# Patient Record
Sex: Male | Born: 1990 | Race: Black or African American | Hispanic: No | Marital: Single | Smoking: Current every day smoker
Health system: Southern US, Community
[De-identification: ages and names within clinical notes are randomized; demographics above are authoritative.]

## PROBLEM LIST (undated history)

## (undated) DIAGNOSIS — W3400XA Accidental discharge from unspecified firearms or gun, initial encounter: Secondary | ICD-10-CM

---
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
## 2010-09-23 LAB — URINALYSIS, ROUTINE W REFLEX MICROSCOPIC
Bilirubin Urine: NEGATIVE
Ketones, ur: NEGATIVE mg/dL
Nitrite: NEGATIVE
Protein, ur: NEGATIVE mg/dL
Specific Gravity, Urine: 1.025 (ref 1.005–1.030)
Urobilinogen, UA: 0.2 mg/dL (ref 0.0–1.0)

## 2013-11-12 ENCOUNTER — Inpatient Hospital Stay (HOSPITAL_COMMUNITY)
Admission: EM | Admit: 2013-11-12 | Discharge: 2013-11-14 | DRG: 563 | Disposition: A | Discharge status: Home Health | Payer: Managed Care, Other (non HMO) | Attending: Orthopaedic Surgery | Admitting: Orthopaedic Surgery

## 2013-11-12 ENCOUNTER — Emergency Department (HOSPITAL_COMMUNITY): Payer: Managed Care, Other (non HMO)

## 2013-11-12 ENCOUNTER — Encounter (HOSPITAL_COMMUNITY): Payer: Self-pay | Admitting: Emergency Medicine

## 2013-11-12 DIAGNOSIS — W3400XA Accidental discharge from unspecified firearms or gun, initial encounter: Secondary | ICD-10-CM

## 2013-11-12 DIAGNOSIS — S81831A Puncture wound without foreign body, right lower leg, initial encounter: Secondary | ICD-10-CM

## 2013-11-12 DIAGNOSIS — F17200 Nicotine dependence, unspecified, uncomplicated: Secondary | ICD-10-CM | POA: Diagnosis present

## 2013-11-12 DIAGNOSIS — S82109B Unspecified fracture of upper end of unspecified tibia, initial encounter for open fracture type I or II: Principal | ICD-10-CM | POA: Diagnosis present

## 2013-11-12 DIAGNOSIS — S82209B Unspecified fracture of shaft of unspecified tibia, initial encounter for open fracture type I or II: Secondary | ICD-10-CM

## 2013-11-12 DIAGNOSIS — S71139A Puncture wound without foreign body, unspecified thigh, initial encounter: Secondary | ICD-10-CM

## 2013-11-12 LAB — CBC WITH DIFFERENTIAL/PLATELET
BASOS ABS: 0.1 10*3/uL (ref 0.0–0.1)
Basophils Relative: 1 % (ref 0–1)
EOS ABS: 0.2 10*3/uL (ref 0.0–0.7)
Eosinophils Relative: 3 % (ref 0–5)
HCT: 41.1 % (ref 39.0–52.0)
Hemoglobin: 13.8 g/dL (ref 13.0–17.0)
LYMPHS PCT: 38 % (ref 12–46)
Lymphs Abs: 2.5 10*3/uL (ref 0.7–4.0)
MCH: 31.6 pg (ref 26.0–34.0)
MCHC: 33.6 g/dL (ref 30.0–36.0)
MCV: 94.1 fL (ref 78.0–100.0)
Monocytes Absolute: 0.8 10*3/uL (ref 0.1–1.0)
Monocytes Relative: 12 % (ref 3–12)
NEUTROS ABS: 3.1 10*3/uL (ref 1.7–7.7)
NEUTROS PCT: 46 % (ref 43–77)
PLATELETS: 296 10*3/uL (ref 150–400)
RBC: 4.37 MIL/uL (ref 4.22–5.81)
RDW: 13.8 % (ref 11.5–15.5)
SMEAR REVIEW: ADEQUATE
WBC: 6.7 10*3/uL (ref 4.0–10.5)

## 2013-11-12 LAB — BASIC METABOLIC PANEL
BUN: 9 mg/dL (ref 6–23)
CO2: 21 mEq/L (ref 19–32)
Calcium: 9.2 mg/dL (ref 8.4–10.5)
Chloride: 98 mEq/L (ref 96–112)
Creatinine, Ser: 1.12 mg/dL (ref 0.50–1.35)
Glucose, Bld: 118 mg/dL — ABNORMAL HIGH (ref 70–99)
POTASSIUM: 3.1 meq/L — AB (ref 3.7–5.3)
SODIUM: 140 meq/L (ref 137–147)

## 2013-11-12 MED ORDER — TETANUS-DIPHTH-ACELL PERTUSSIS 5-2.5-18.5 LF-MCG/0.5 IM SUSP
0.5000 mL | Freq: Once | INTRAMUSCULAR | Status: AC
  Administered 2013-11-12: 0.5 mL via INTRAMUSCULAR
  Filled 2013-11-12: qty 0.5

## 2013-11-12 MED ORDER — CEFAZOLIN SODIUM 1-5 GM-% IV SOLN
1.0000 g | Freq: Once | INTRAVENOUS | Status: AC
  Administered 2013-11-12: 1 g via INTRAVENOUS
  Filled 2013-11-12: qty 50

## 2013-11-12 MED ORDER — VANCOMYCIN HCL IN DEXTROSE 1-5 GM/200ML-% IV SOLN
1000.0000 mg | Freq: Once | INTRAVENOUS | Status: AC
  Administered 2013-11-12: 1000 mg via INTRAVENOUS
  Filled 2013-11-12: qty 200

## 2013-11-12 MED ORDER — HYDROMORPHONE HCL PF 1 MG/ML IJ SOLN
1.0000 mg | INTRAMUSCULAR | Status: DC | PRN
  Administered 2013-11-12: 1 mg via INTRAVENOUS
  Filled 2013-11-12: qty 1

## 2013-11-12 NOTE — ED Notes (Signed)
Surgical team called into department for patient.
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
## 2013-11-13 ENCOUNTER — Encounter (HOSPITAL_COMMUNITY): Payer: Managed Care, Other (non HMO) | Admitting: Anesthesiology

## 2013-11-13 ENCOUNTER — Inpatient Hospital Stay (HOSPITAL_COMMUNITY): Payer: Managed Care, Other (non HMO) | Admitting: Anesthesiology

## 2013-11-13 ENCOUNTER — Encounter (HOSPITAL_COMMUNITY): Payer: Self-pay | Admitting: *Deleted

## 2013-11-13 ENCOUNTER — Inpatient Hospital Stay (HOSPITAL_COMMUNITY): Payer: Managed Care, Other (non HMO)

## 2013-11-13 ENCOUNTER — Encounter (HOSPITAL_COMMUNITY)
Admission: EM | Disposition: A | Discharge status: Home Health | Payer: Self-pay | Source: Home / Self Care | Attending: Orthopaedic Surgery

## 2013-11-13 DIAGNOSIS — S71139A Puncture wound without foreign body, unspecified thigh, initial encounter: Secondary | ICD-10-CM

## 2013-11-13 DIAGNOSIS — W3400XA Accidental discharge from unspecified firearms or gun, initial encounter: Secondary | ICD-10-CM

## 2013-11-13 HISTORY — PX: FOREIGN BODY REMOVAL: SHX962

## 2013-11-13 HISTORY — PX: CAST APPLICATION: SHX380

## 2013-11-13 LAB — BASIC METABOLIC PANEL
BUN: 6 mg/dL (ref 6–23)
CHLORIDE: 102 meq/L (ref 96–112)
CO2: 26 mEq/L (ref 19–32)
CREATININE: 0.9 mg/dL (ref 0.50–1.35)
Calcium: 8.5 mg/dL (ref 8.4–10.5)
GFR calc non Af Amer: >90 mL/min (ref >90)
GLUCOSE: 124 mg/dL — AB (ref 70–99)
POTASSIUM: 3.6 meq/L — AB (ref 3.7–5.3)
Sodium: 139 mEq/L (ref 137–147)

## 2013-11-13 LAB — CBC WITH DIFFERENTIAL/PLATELET
Basophils Absolute: 0 10*3/uL (ref 0.0–0.1)
Basophils Relative: 0 % (ref 0–1)
EOS PCT: 1 % (ref 0–5)
Eosinophils Absolute: 0.1 10*3/uL (ref 0.0–0.7)
HCT: 36.1 % — ABNORMAL LOW (ref 39.0–52.0)
HEMOGLOBIN: 12.2 g/dL — AB (ref 13.0–17.0)
Lymphocytes Relative: 24 % (ref 12–46)
Lymphs Abs: 2.1 10*3/uL (ref 0.7–4.0)
MCH: 31.5 pg (ref 26.0–34.0)
MCHC: 33.8 g/dL (ref 30.0–36.0)
MCV: 93.3 fL (ref 78.0–100.0)
MONOS PCT: 12 % (ref 3–12)
Monocytes Absolute: 1.1 10*3/uL — ABNORMAL HIGH (ref 0.1–1.0)
NEUTROS ABS: 5.5 10*3/uL (ref 1.7–7.7)
Neutrophils Relative %: 63 % (ref 43–77)
Platelets: 288 10*3/uL (ref 150–400)
RBC: 3.87 MIL/uL — ABNORMAL LOW (ref 4.22–5.81)
RDW: 14 % (ref 11.5–15.5)
WBC: 8.7 10*3/uL (ref 4.0–10.5)

## 2013-11-13 SURGERY — REMOVAL FOREIGN BODY EXTREMITY
Anesthesia: General | Site: Leg Lower | Laterality: Right

## 2013-11-13 MED ORDER — DIPHENHYDRAMINE HCL 12.5 MG/5ML PO ELIX
12.5000 mg | ORAL_SOLUTION | Freq: Four times a day (QID) | ORAL | Status: DC | PRN
  Filled 2013-11-13: qty 5

## 2013-11-13 MED ORDER — MAGNESIUM HYDROXIDE 400 MG/5ML PO SUSP
30.0000 mL | Freq: Every day | ORAL | Status: DC | PRN
  Filled 2013-11-13: qty 30

## 2013-11-13 MED ORDER — ARTIFICIAL TEARS OP OINT
TOPICAL_OINTMENT | OPHTHALMIC | Status: DC | PRN
  Administered 2013-11-13: 1 via OPHTHALMIC

## 2013-11-13 MED ORDER — ALBUTEROL SULFATE (2.5 MG/3ML) 0.083% IN NEBU: 2.5000 mg | INHALATION_SOLUTION | Freq: Four times a day (QID) | RESPIRATORY_TRACT | Status: DC | PRN

## 2013-11-13 MED ORDER — ALBUTEROL SULFATE (2.5 MG/3ML) 0.083% IN NEBU: 2.5000 mg | INHALATION_SOLUTION | Freq: Four times a day (QID) | RESPIRATORY_TRACT | Status: DC

## 2013-11-13 MED ORDER — ONDANSETRON HCL 4 MG/2ML IJ SOLN: 4.0000 mg | Freq: Four times a day (QID) | INTRAMUSCULAR | Status: DC | PRN

## 2013-11-13 MED ORDER — DEXTROSE-NACL 5-0.45 % IV SOLN
INTRAVENOUS | Status: DC
  Administered 2013-11-13 (×2): via INTRAVENOUS

## 2013-11-13 MED ORDER — SUCCINYLCHOLINE CHLORIDE 20 MG/ML IJ SOLN
INTRAMUSCULAR | Status: DC | PRN
  Administered 2013-11-13: 140 mg via INTRAVENOUS

## 2013-11-13 MED ORDER — MIDAZOLAM HCL 5 MG/5ML IJ SOLN
INTRAMUSCULAR | Status: DC | PRN
  Administered 2013-11-13: 2 mg via INTRAVENOUS

## 2013-11-13 MED ORDER — LIDOCAINE HCL (CARDIAC) 20 MG/ML IV SOLN
INTRAVENOUS | Status: DC | PRN
Start: 2013-11-13 — End: 2013-11-13
  Administered 2013-11-13: 50 mg via INTRAVENOUS

## 2013-11-13 MED ORDER — NALOXONE HCL 0.4 MG/ML IJ SOLN: 0.4000 mg | INTRAMUSCULAR | Status: DC | PRN

## 2013-11-13 MED ORDER — SODIUM CHLORIDE 0.9 % IV SOLN
INTRAVENOUS | Status: DC | PRN
  Administered 2013-11-13: via INTRAVENOUS

## 2013-11-13 MED ORDER — LACTATED RINGERS IV SOLN
INTRAVENOUS | Status: DC | PRN
  Administered 2013-11-13: 01:00:00 via INTRAVENOUS

## 2013-11-13 MED ORDER — HYDROMORPHONE 0.3 MG/ML IV SOLN
INTRAVENOUS | Status: DC
  Administered 2013-11-13 (×2): 0.9 mg via INTRAVENOUS
  Administered 2013-11-13: 1.8 mg via INTRAVENOUS
  Administered 2013-11-13 – 2013-11-14 (×2): via INTRAVENOUS
  Filled 2013-11-13 (×2): qty 25

## 2013-11-13 MED ORDER — PROMETHAZINE HCL 25 MG/ML IJ SOLN: 12.5000 mg | INTRAMUSCULAR | Status: DC | PRN

## 2013-11-13 MED ORDER — ONDANSETRON HCL 4 MG/2ML IJ SOLN
INTRAMUSCULAR | Status: DC | PRN
  Administered 2013-11-13: 4 mg via INTRAVENOUS

## 2013-11-13 MED ORDER — ENOXAPARIN SODIUM 40 MG/0.4ML ~~LOC~~ SOLN
40.0000 mg | SUBCUTANEOUS | Status: DC
  Administered 2013-11-14: 40 mg via SUBCUTANEOUS
  Filled 2013-11-13: qty 0.4

## 2013-11-13 MED ORDER — PROPOFOL 10 MG/ML IV BOLUS
INTRAVENOUS | Status: DC | PRN
  Administered 2013-11-13: 180 mg via INTRAVENOUS

## 2013-11-13 MED ORDER — SODIUM CHLORIDE 0.9 % IR SOLN
Status: DC | PRN
  Administered 2013-11-13: 1000 mL
  Administered 2013-11-13: 3000 mL

## 2013-11-13 MED ORDER — FENTANYL CITRATE 0.05 MG/ML IJ SOLN
INTRAMUSCULAR | Status: DC | PRN
  Administered 2013-11-13 (×3): 50 ug via INTRAVENOUS
  Administered 2013-11-13: 100 ug via INTRAVENOUS

## 2013-11-13 MED ORDER — SODIUM CHLORIDE 0.9 % IJ SOLN: 9.0000 mL | INTRAMUSCULAR | Status: DC | PRN

## 2013-11-13 MED ORDER — DIPHENHYDRAMINE HCL 50 MG/ML IJ SOLN: 12.5000 mg | Freq: Four times a day (QID) | INTRAMUSCULAR | Status: DC | PRN

## 2013-11-13 MED ORDER — GLYCOPYRROLATE 0.2 MG/ML IJ SOLN
INTRAMUSCULAR | Status: DC | PRN
  Administered 2013-11-13: 0.4 mg via INTRAVENOUS

## 2013-11-13 MED ORDER — VANCOMYCIN HCL IN DEXTROSE 1-5 GM/200ML-% IV SOLN
1000.0000 mg | Freq: Two times a day (BID) | INTRAVENOUS | Status: DC
  Administered 2013-11-13 – 2013-11-14 (×3): 1000 mg via INTRAVENOUS
  Filled 2013-11-13 (×3): qty 200

## 2013-11-13 SURGICAL SUPPLY — 53 items
BAG HAMPER (MISCELLANEOUS) ×3 IMPLANT
BANDAGE ELASTIC 4 VELCRO NS (GAUZE/BANDAGES/DRESSINGS) ×1 IMPLANT
BANDAGE ELASTIC 6 VELCRO NS (GAUZE/BANDAGES/DRESSINGS) ×1 IMPLANT
BANDAGE ESMARK 4X12 BL STRL LF (DISPOSABLE) IMPLANT
BNDG CMPR 12X4 ELC STRL LF (DISPOSABLE)
BNDG ESMARK 4X12 BLUE STRL LF (DISPOSABLE)
BNDG PLASTER X FAST 4X5 WHT LF (CAST SUPPLIES) ×4 IMPLANT
BNDG PLASTER X FAST 6X5 WHT LF (CAST SUPPLIES) ×12 IMPLANT
BNDG PLSTR 5X4 XFST ST WHT LF (CAST SUPPLIES) ×2
BNDG PLSTR 5X6 XFST ST WHT LF (CAST SUPPLIES) ×6
CLOTH BEACON ORANGE TIMEOUT ST (SAFETY) ×3 IMPLANT
COVER LIGHT HANDLE STERIS (MISCELLANEOUS) ×6 IMPLANT
CUFF TOURNIQUET SINGLE 18IN (TOURNIQUET CUFF) IMPLANT
CUFF TOURNIQUET SINGLE 24IN (TOURNIQUET CUFF) IMPLANT
CUFF TOURNIQUET SINGLE 34IN LL (TOURNIQUET CUFF) ×4 IMPLANT
DURAPREP 26ML APPLICATOR (WOUND CARE) ×1 IMPLANT
GAUZE SPONGE 4X4 12PLY STRL (GAUZE/BANDAGES/DRESSINGS) ×3 IMPLANT
GAUZE XEROFORM 5X9 LF (GAUZE/BANDAGES/DRESSINGS) ×3 IMPLANT
GLOVE BIO SURGEON STRL SZ8 (GLOVE) ×3 IMPLANT
GLOVE BIO SURGEON STRL SZ8.5 (GLOVE) ×3 IMPLANT
GLOVE BIOGEL PI IND STRL 7.0 (GLOVE) IMPLANT
GLOVE BIOGEL PI IND STRL 7.5 (GLOVE) IMPLANT
GLOVE BIOGEL PI INDICATOR 7.0 (GLOVE) ×2
GLOVE BIOGEL PI INDICATOR 7.5 (GLOVE) ×2
GLOVE ECLIPSE 7.0 STRL STRAW (GLOVE) ×2 IMPLANT
GOWN STRL REUS W/TWL LRG LVL3 (GOWN DISPOSABLE) ×6 IMPLANT
GOWN STRL REUS W/TWL XL LVL3 (GOWN DISPOSABLE) ×3 IMPLANT
HANDPIECE INTERPULSE COAX TIP (DISPOSABLE) ×3
IV NS IRRIG 3000ML ARTHROMATIC (IV SOLUTION) ×2 IMPLANT
KIT ROOM TURNOVER APOR (KITS) ×3 IMPLANT
MANIFOLD NEPTUNE II (INSTRUMENTS) ×3 IMPLANT
NDL HYPO 21X1.5 SAFETY (NEEDLE) IMPLANT
NDL HYPO 25X1 1.5 SAFETY (NEEDLE) IMPLANT
NDL SAFETY ECLIPSE 18X1.5 (NEEDLE) IMPLANT
NEEDLE HYPO 18GX1.5 SHARP (NEEDLE)
NEEDLE HYPO 21X1.5 SAFETY (NEEDLE) IMPLANT
NEEDLE HYPO 25X1 1.5 SAFETY (NEEDLE) IMPLANT
NS IRRIG 1000ML POUR BTL (IV SOLUTION) ×3 IMPLANT
PACK BASIC LIMB (CUSTOM PROCEDURE TRAY) ×3 IMPLANT
PAD ABD 5X9 TENDERSORB (GAUZE/BANDAGES/DRESSINGS) ×10 IMPLANT
PAD ARMBOARD 7.5X6 YLW CONV (MISCELLANEOUS) ×3 IMPLANT
PAD CAST 4YDX4 CTTN HI CHSV (CAST SUPPLIES) ×2 IMPLANT
PADDING CAST COTTON 4X4 STRL (CAST SUPPLIES) ×6
PADDING WEBRIL 4 STERILE (GAUZE/BANDAGES/DRESSINGS) ×8 IMPLANT
SET BASIN LINEN APH (SET/KITS/TRAYS/PACK) ×3 IMPLANT
SET HNDPC FAN SPRY TIP SCT (DISPOSABLE) IMPLANT
SOL PREP PROV IODINE SCRUB 4OZ (MISCELLANEOUS) ×3 IMPLANT
SPONGE GAUZE 4X4 12PLY (GAUZE/BANDAGES/DRESSINGS) ×4 IMPLANT
SUT CHROMIC 2 0 CT 1 (SUTURE) ×2 IMPLANT
SUT ETHILON 3 0 FSL (SUTURE) ×2 IMPLANT
SUT SILK 2 0 SH (SUTURE) ×1 IMPLANT
TOWEL OR 17X26 4PK STRL BLUE (TOWEL DISPOSABLE) ×3 IMPLANT
WATER STERILE IRR 1000ML POUR (IV SOLUTION) ×4 IMPLANT

## 2013-11-13 NOTE — Anesthesia Preprocedure Evaluation (Signed)
Anesthesia Evaluation  Patient identified by MRN, date of birth, ID band Patient awake    Reviewed: Allergy & Precautions, H&P , NPO status , Patient's Chart, lab work & pertinent test results, reviewed documented beta blocker date and time   Airway Mallampati: II TM Distance: >3 FB Neck ROM: full    Dental  (+) Teeth Intact   Pulmonary Current Smoker,    Pulmonary exam normal       Cardiovascular Exercise Tolerance: Good negative cardio ROS      Neuro/Psych negative neurological ROS  negative psych ROS   GI/Hepatic negative GI ROS, Neg liver ROS,   Endo/Other  negative endocrine ROS  Renal/GU negative Renal ROS  negative genitourinary   Musculoskeletal   Abdominal Normal abdominal exam  (+)   Peds  Hematology negative hematology ROS (+)   Anesthesia Other Findings   Reproductive/Obstetrics                           Anesthesia Physical Anesthesia Plan  ASA: II and emergent  Anesthesia Plan: General ETT, Rapid Sequence and Cricoid Pressure   Post-op Pain Management:    Induction:   Airway Management Planned:   Additional Equipment:   Intra-op Plan:   Post-operative Plan:   Informed Consent: I have reviewed the patients History and Physical, chart, labs and discussed the procedure including the risks, benefits and alternatives for the proposed anesthesia with the patient or authorized representative who has indicated his/her understanding and acceptance.   Dental Advisory Given  Plan Discussed with: Surgeon and Anesthesiologist  Anesthesia Plan Comments:         Anesthesia Quick Evaluation
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
## 2013-11-13 NOTE — Anesthesia Procedure Notes (Signed)
Procedure Name: Intubation Date/Time: 11/13/2013 12:44 AM Performed by: Pernell Dupre, AMY A Pre-anesthesia Checklist: Patient identified, Patient being monitored, Timeout performed, Emergency Drugs available and Suction available Patient Re-evaluated:Patient Re-evaluated prior to inductionOxygen Delivery Method: Circle System Utilized Preoxygenation: Pre-oxygenation with 100% oxygen Intubation Type: IV induction Ventilation: Mask ventilation without difficulty Laryngoscope Size: 3 and Miller Grade View: Grade I Tube type: Oral Tube size: 7.0 mm Number of attempts: 1 Airway Equipment and Method: stylet Placement Confirmation: ETT inserted through vocal cords under direct vision,  positive ETCO2 and breath sounds checked- equal and bilateral Secured at: 21 cm Tube secured with: Tape Dental Injury: Teeth and Oropharynx as per pre-operative assessment
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
## 2013-11-13 NOTE — Care Management Note (Signed)
    Page 1 of 1   11/14/2013     2:32:15 PM CARE MANAGEMENT NOTE 11/14/2013  Patient:  Adam Logan, Adam Logan   Account Number:  [id number]  Date Initiated:  11/13/2013  Documentation initiated by:  Rosemary Holms  Subjective/Objective Assessment:   Pt from home with family. No PCP but states he has insurance. Brother has a picture of the care on his iphone.     Action/Plan:   Jeri Lager in financial counseling with [employer] group and ID number.   Anticipated DC Date:  11/14/2013   Anticipated DC Plan:  HOME/SELF CARE      DC Planning Services  CM consult      Choice offered to / List presented to:          [facility] arranged  HH-2 PT  HH-3 OT      HH agency  OTHER - SEE NOTE   Status of service:  Completed, signed off Medicare Important Message given?   (If response is "NO", the following Medicare IM given date fields will be blank) Date Medicare IM given:   Date Additional Medicare IM given:    Discharge Disposition:  HOME W HOME HEALTH SERVICES  Per UR Regulation:    If discussed at Long Length of Stay Meetings, dates discussed:    Comments:  11/14/13 Rosemary Holms RN CM CM notified [facility] of [facility] needs. They will set up [facility] and let pt know. [facility] contact information on Pt's DC instructions in case they do not contact him. RN requested a wheel chair but Dr. Hilda Lias denied this request.  11/13/13 Rosemary Holms RN CM
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
## 2013-11-13 NOTE — ED Notes (Signed)
Patient clothes and cell phone given to patient's mother prior to going to OR.

## 2013-11-14 ENCOUNTER — Encounter (HOSPITAL_COMMUNITY): Payer: Self-pay | Admitting: Orthopaedic Surgery

## 2013-11-14 LAB — BASIC METABOLIC PANEL
BUN: 5 mg/dL — AB (ref 6–23)
CALCIUM: 8.9 mg/dL (ref 8.4–10.5)
CHLORIDE: 101 meq/L (ref 96–112)
CO2: 28 meq/L (ref 19–32)
CREATININE: 0.85 mg/dL (ref 0.50–1.35)
GFR calc Af Amer: >90 mL/min (ref >90)
GFR calc non Af Amer: >90 mL/min (ref >90)
Glucose, Bld: 109 mg/dL — ABNORMAL HIGH (ref 70–99)
Potassium: 3.7 mEq/L (ref 3.7–5.3)
Sodium: 140 mEq/L (ref 137–147)

## 2013-11-14 LAB — CBC WITH DIFFERENTIAL/PLATELET
BASOS ABS: 0 10*3/uL (ref 0.0–0.1)
BASOS PCT: 0 % (ref 0–1)
EOS ABS: 0.2 10*3/uL (ref 0.0–0.7)
EOS PCT: 2 % (ref 0–5)
HEMATOCRIT: 36.3 % — AB (ref 39.0–52.0)
Hemoglobin: 12.3 g/dL — ABNORMAL LOW (ref 13.0–17.0)
Lymphocytes Relative: 22 % (ref 12–46)
Lymphs Abs: 1.6 10*3/uL (ref 0.7–4.0)
MCH: 31.5 pg (ref 26.0–34.0)
MCHC: 33.9 g/dL (ref 30.0–36.0)
MCV: 92.8 fL (ref 78.0–100.0)
Monocytes Absolute: 1.1 10*3/uL — ABNORMAL HIGH (ref 0.1–1.0)
Monocytes Relative: 15 % — ABNORMAL HIGH (ref 3–12)
Neutro Abs: 4.3 10*3/uL (ref 1.7–7.7)
Neutrophils Relative %: 61 % (ref 43–77)
PLATELETS: 276 10*3/uL (ref 150–400)
RBC: 3.91 MIL/uL — ABNORMAL LOW (ref 4.22–5.81)
RDW: 13.6 % (ref 11.5–15.5)
WBC: 7.2 10*3/uL (ref 4.0–10.5)

## 2013-11-14 MED ORDER — DOXYCYCLINE HYCLATE 50 MG PO CAPS: ORAL_CAPSULE | ORAL | Status: DC

## 2013-11-14 MED ORDER — OXYCODONE-ACETAMINOPHEN 5-325 MG PO TABS
2.0000 | ORAL_TABLET | ORAL | Status: DC | PRN
  Administered 2013-11-14: 2 via ORAL
  Filled 2013-11-14: qty 2

## 2013-11-14 MED ORDER — OXYCODONE-ACETAMINOPHEN 7.5-325 MG PO TABS: 1.0000 | ORAL_TABLET | ORAL | Status: DC | PRN
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
## 2013-11-14 NOTE — Discharge Instructions (Signed)
Elevate right lower leg and cast.  Keep cast dry.  Use crutches.  Take pain medicine and antibiotic as directed.  Keep appointment with Dr. Hilda Lias in one week.  If any problem, call his office at [phone], or if after hours, the hospital at [phone].  Home health has been arranged for you.

## 2013-11-14 NOTE — Progress Notes (Signed)
Patient discharged home instructions were given on discharge medications,and follow up visits,patient,and family verbalized understanding.[facility] to follow up with patient.Prescriptions sent with patient.Vital signs stable.Accompanied by staff to awaiting vehicle.
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
## 2013-11-14 NOTE — Addendum Note (Signed)
Addendum created 11/14/13 1052 by Franco Nones, CRNA   Modules edited: Notes Section   Notes Section:  File: [id number]
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
## 2013-11-19 NOTE — Care Management Utilization Note (Signed)
UR completed 

## 2014-02-11 ENCOUNTER — Other Ambulatory Visit (HOSPITAL_COMMUNITY): Payer: Self-pay | Admitting: Orthopaedic Surgery

## 2014-02-11 ENCOUNTER — Ambulatory Visit (HOSPITAL_COMMUNITY)
Admission: RE | Admit: 2014-02-11 | Discharge: 2014-02-11 | Disposition: A | Discharge status: Home / Self Care | Payer: Managed Care, Other (non HMO) | Source: Ambulatory Visit | Attending: Orthopaedic Surgery | Admitting: Orthopaedic Surgery

## 2014-02-11 DIAGNOSIS — T148XXA Other injury of unspecified body region, initial encounter: Secondary | ICD-10-CM

## 2014-02-11 DIAGNOSIS — X58XXXA Exposure to other specified factors, initial encounter: Secondary | ICD-10-CM | POA: Insufficient documentation

## 2014-06-13 DIAGNOSIS — W3400XA Accidental discharge from unspecified firearms or gun, initial encounter: Secondary | ICD-10-CM

## 2014-06-13 HISTORY — DX: Accidental discharge from unspecified firearms or gun, initial encounter: W34.00XA

## 2014-07-31 ENCOUNTER — Encounter (HOSPITAL_COMMUNITY): Payer: Self-pay

## 2014-07-31 ENCOUNTER — Emergency Department (HOSPITAL_COMMUNITY): Payer: Managed Care, Other (non HMO)

## 2014-07-31 ENCOUNTER — Observation Stay (HOSPITAL_COMMUNITY)
Admission: EM | Admit: 2014-07-31 | Discharge: 2014-08-01 | Disposition: A | Discharge status: Home / Self Care | Payer: Managed Care, Other (non HMO) | Attending: General Surgery | Admitting: General Surgery

## 2014-07-31 DIAGNOSIS — R112 Nausea with vomiting, unspecified: Principal | ICD-10-CM | POA: Insufficient documentation

## 2014-07-31 DIAGNOSIS — R109 Unspecified abdominal pain: Secondary | ICD-10-CM | POA: Diagnosis present

## 2014-07-31 DIAGNOSIS — Z720 Tobacco use: Secondary | ICD-10-CM | POA: Diagnosis not present

## 2014-07-31 LAB — BASIC METABOLIC PANEL
Anion gap: 5 (ref 5–15)
BUN: 11 mg/dL (ref 6–23)
CO2: 29 mmol/L (ref 19–32)
Calcium: 9.3 mg/dL (ref 8.4–10.5)
Chloride: 108 mmol/L (ref 96–112)
Creatinine, Ser: 1.53 mg/dL — ABNORMAL HIGH (ref 0.50–1.35)
GFR, EST AFRICAN AMERICAN: 73 mL/min — AB (ref >90)
GFR, EST NON AFRICAN AMERICAN: 63 mL/min — AB (ref >90)
Glucose, Bld: 95 mg/dL (ref 70–99)
Potassium: 3.7 mmol/L (ref 3.5–5.1)
SODIUM: 142 mmol/L (ref 135–145)

## 2014-07-31 LAB — URINALYSIS, ROUTINE W REFLEX MICROSCOPIC
BILIRUBIN URINE: NEGATIVE
Glucose, UA: NEGATIVE mg/dL
KETONES UR: NEGATIVE mg/dL
Leukocytes, UA: NEGATIVE
NITRITE: NEGATIVE
PROTEIN: NEGATIVE mg/dL
Specific Gravity, Urine: 1.01 (ref 1.005–1.030)
UROBILINOGEN UA: 0.2 mg/dL (ref 0.0–1.0)
pH: 5.5 (ref 5.0–8.0)

## 2014-07-31 LAB — URINE MICROSCOPIC-ADD ON

## 2014-07-31 LAB — HEPATIC FUNCTION PANEL
ALT: 44 U/L (ref 0–53)
AST: 75 U/L — ABNORMAL HIGH (ref 0–37)
Albumin: 4 g/dL (ref 3.5–5.2)
Alkaline Phosphatase: 74 U/L (ref 39–117)
Bilirubin, Direct: <0.1 mg/dL (ref 0.0–0.5)
TOTAL PROTEIN: 7.8 g/dL (ref 6.0–8.3)
Total Bilirubin: 0.5 mg/dL (ref 0.3–1.2)

## 2014-07-31 LAB — CBC WITH DIFFERENTIAL/PLATELET
BASOS ABS: 0 10*3/uL (ref 0.0–0.1)
Basophils Relative: 1 % (ref 0–1)
EOS PCT: 3 % (ref 0–5)
Eosinophils Absolute: 0.2 10*3/uL (ref 0.0–0.7)
HCT: 42.5 % (ref 39.0–52.0)
Hemoglobin: 13.9 g/dL (ref 13.0–17.0)
LYMPHS ABS: 1 10*3/uL (ref 0.7–4.0)
LYMPHS PCT: 16 % (ref 12–46)
MCH: 31.2 pg (ref 26.0–34.0)
MCHC: 32.7 g/dL (ref 30.0–36.0)
MCV: 95.3 fL (ref 78.0–100.0)
Monocytes Absolute: 0.5 10*3/uL (ref 0.1–1.0)
Monocytes Relative: 8 % (ref 3–12)
NEUTROS ABS: 4.4 10*3/uL (ref 1.7–7.7)
Neutrophils Relative %: 73 % (ref 43–77)
RBC: 4.46 MIL/uL (ref 4.22–5.81)
RDW: 13.6 % (ref 11.5–15.5)
WBC: 6 10*3/uL (ref 4.0–10.5)

## 2014-07-31 LAB — LIPASE, BLOOD: Lipase: 24 U/L (ref 11–59)

## 2014-07-31 MED ORDER — ENOXAPARIN SODIUM 40 MG/0.4ML ~~LOC~~ SOLN: 40.0000 mg | SUBCUTANEOUS | Status: DC

## 2014-07-31 MED ORDER — SODIUM CHLORIDE 0.9 % IV BOLUS (SEPSIS)
1000.0000 mL | Freq: Once | INTRAVENOUS | Status: AC
  Administered 2014-07-31: 1000 mL via INTRAVENOUS

## 2014-07-31 MED ORDER — LACTATED RINGERS IV SOLN
INTRAVENOUS | Status: DC
  Administered 2014-07-31: 23:00:00 via INTRAVENOUS

## 2014-07-31 MED ORDER — ONDANSETRON HCL 4 MG/2ML IJ SOLN
4.0000 mg | INTRAMUSCULAR | Status: DC | PRN
  Administered 2014-07-31: 4 mg via INTRAVENOUS
  Filled 2014-07-31: qty 2

## 2014-07-31 MED ORDER — ACETAMINOPHEN 650 MG RE SUPP: 650.0000 mg | Freq: Four times a day (QID) | RECTAL | Status: DC | PRN

## 2014-07-31 MED ORDER — MORPHINE SULFATE 2 MG/ML IJ SOLN
2.0000 mg | INTRAMUSCULAR | Status: AC | PRN
  Administered 2014-07-31 (×2): 2 mg via INTRAVENOUS
  Filled 2014-07-31 (×2): qty 1

## 2014-07-31 MED ORDER — DIPHENHYDRAMINE HCL 12.5 MG/5ML PO ELIX
12.5000 mg | ORAL_SOLUTION | Freq: Four times a day (QID) | ORAL | Status: DC | PRN
Start: 2014-07-31 — End: 2014-08-01

## 2014-07-31 MED ORDER — FAMOTIDINE IN NACL 20-0.9 MG/50ML-% IV SOLN
20.0000 mg | Freq: Once | INTRAVENOUS | Status: AC
  Administered 2014-07-31: 20 mg via INTRAVENOUS
  Filled 2014-07-31: qty 50

## 2014-07-31 MED ORDER — PIPERACILLIN-TAZOBACTAM 3.375 G IVPB
3.3750 g | Freq: Three times a day (TID) | INTRAVENOUS | Status: DC
  Administered 2014-07-31 – 2014-08-01 (×2): 3.375 g via INTRAVENOUS
  Filled 2014-07-31 (×12): qty 50

## 2014-07-31 MED ORDER — PIPERACILLIN-TAZOBACTAM 3.375 G IVPB
INTRAVENOUS | Status: AC
  Filled 2014-07-31: qty 100

## 2014-07-31 MED ORDER — ENOXAPARIN SODIUM 40 MG/0.4ML ~~LOC~~ SOLN
40.0000 mg | SUBCUTANEOUS | Status: DC
  Administered 2014-07-31: 40 mg via SUBCUTANEOUS
  Filled 2014-07-31: qty 0.4

## 2014-07-31 MED ORDER — ONDANSETRON HCL 4 MG/2ML IJ SOLN: 4.0000 mg | Freq: Four times a day (QID) | INTRAMUSCULAR | Status: DC | PRN

## 2014-07-31 MED ORDER — DIPHENHYDRAMINE HCL 50 MG/ML IJ SOLN: 12.5000 mg | Freq: Four times a day (QID) | INTRAMUSCULAR | Status: DC | PRN

## 2014-07-31 MED ORDER — HYDROMORPHONE HCL 1 MG/ML IJ SOLN
1.0000 mg | INTRAMUSCULAR | Status: DC | PRN
  Administered 2014-08-01: 1 mg via INTRAVENOUS
  Filled 2014-07-31: qty 1

## 2014-07-31 MED ORDER — SODIUM CHLORIDE 0.9 % IV SOLN: 1.0000 g | Freq: Once | INTRAVENOUS | Status: DC

## 2014-07-31 MED ORDER — IOHEXOL 300 MG/ML  SOLN
50.0000 mL | Freq: Once | INTRAMUSCULAR | Status: AC | PRN
  Administered 2014-07-31: 50 mL via ORAL

## 2014-07-31 MED ORDER — PANTOPRAZOLE SODIUM 40 MG PO TBEC
40.0000 mg | DELAYED_RELEASE_TABLET | Freq: Every day | ORAL | Status: DC
Start: 2014-07-31 — End: 2014-08-01
  Administered 2014-07-31 – 2014-08-01 (×2): 40 mg via ORAL
  Filled 2014-07-31 (×2): qty 1

## 2014-07-31 MED ORDER — ALUM & MAG HYDROXIDE-SIMETH 200-200-20 MG/5ML PO SUSP: 30.0000 mL | ORAL | Status: DC | PRN

## 2014-07-31 MED ORDER — ACETAMINOPHEN 325 MG PO TABS: 650.0000 mg | ORAL_TABLET | Freq: Four times a day (QID) | ORAL | Status: DC | PRN

## 2014-07-31 MED ORDER — SODIUM CHLORIDE 0.9 % IV SOLN
INTRAVENOUS | Status: DC
  Administered 2014-07-31: 19:00:00 via INTRAVENOUS

## 2014-07-31 MED ORDER — HYDROCODONE-ACETAMINOPHEN 5-325 MG PO TABS: 1.0000 | ORAL_TABLET | ORAL | Status: DC | PRN

## 2014-07-31 NOTE — ED Notes (Signed)
EMTALA filled out on this pt. Incorrectly.

## 2014-07-31 NOTE — ED Notes (Signed)
Per Dr. Clarene DukeMcManus pt. Able to have clear liquids until midnight. Pt. Given Sprite.
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
## 2014-07-31 NOTE — ED Notes (Signed)
Attempted to call report. RN to call back. 

## 2014-07-31 NOTE — ED Notes (Signed)
Pt. To have CT at 1910.

## 2014-07-31 NOTE — ED Notes (Signed)
Pt c/o abd pain and vomiting since last Friday.  Denies diarrhea, lbm was 2 days ago.

## 2014-08-01 LAB — BASIC METABOLIC PANEL
Anion gap: 5 (ref 5–15)
BUN: 8 mg/dL (ref 6–23)
CALCIUM: 8.8 mg/dL (ref 8.4–10.5)
CO2: 30 mmol/L (ref 19–32)
CREATININE: 1.34 mg/dL (ref 0.50–1.35)
Chloride: 106 mmol/L (ref 96–112)
GFR, EST AFRICAN AMERICAN: 85 mL/min — AB (ref >90)
GFR, EST NON AFRICAN AMERICAN: 74 mL/min — AB (ref >90)
GLUCOSE: 92 mg/dL (ref 70–99)
Potassium: 3.8 mmol/L (ref 3.5–5.1)
SODIUM: 141 mmol/L (ref 135–145)

## 2014-08-01 LAB — CBC
HCT: 37.7 % — ABNORMAL LOW (ref 39.0–52.0)
Hemoglobin: 12.2 g/dL — ABNORMAL LOW (ref 13.0–17.0)
MCH: 30.7 pg (ref 26.0–34.0)
MCHC: 32.4 g/dL (ref 30.0–36.0)
MCV: 95 fL (ref 78.0–100.0)
PLATELETS: 263 10*3/uL (ref 150–400)
RBC: 3.97 MIL/uL — ABNORMAL LOW (ref 4.22–5.81)
RDW: 13.6 % (ref 11.5–15.5)
WBC: 5.6 10*3/uL (ref 4.0–10.5)

## 2014-08-01 LAB — SURGICAL PCR SCREEN
MRSA, PCR: NEGATIVE
STAPHYLOCOCCUS AUREUS: NEGATIVE

## 2014-08-01 MED ORDER — AZITHROMYCIN 250 MG PO TABS: ORAL_TABLET | ORAL | Status: DC
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
## 2014-08-01 NOTE — Discharge Summary (Signed)
Physician Discharge Summary  Patient ID: Adam Logan MRN: [id number] DOB/AGE: [birth year] 23 y.o.  Admit date: 07/31/2014 Discharge date: 08/01/2014  Admission Diagnoses: Left-sided abdominal pain. Abnormal CT scan of the abdomen, upper respiratory infection  Discharge Diagnoses: Same Active Problems:   Abdominal pain   Discharged Condition: good  Hospital Course: Patient is a 24 year old black male who presented emergency room with a four-day history of worsening left upper quadrant and left-sided abdominal pain. CT scan the abdomen interestingly revealed possible early acute appendicitis with an appendicolith. He denied any right lower quadrant abdominal pain. He was admitted overnight for observation. The following morning, he had no leukocytosis. He had no right lower quadrant abdominal pain. His diet was advanced without difficulty. He is being discharged home in good and improving condition.  Discharge Exam: Blood pressure 126/54, pulse 50, temperature 98.5 F (36.9 C), temperature source Oral, resp. rate 20, height 6\' 2"  (1.88 m), weight 94.348 kg (208 lb), SpO2 100 %. General appearance: alert, cooperative and no distress Resp: clear to auscultation bilaterally Cardio: regular rate and rhythm, S1, S2 normal, no murmur, click, rub or gallop GI: soft, non-tender; bowel sounds normal; no masses,  no organomegaly  Disposition: 06-Home-Health Care Svc     Medication List    STOP taking these medications        doxycycline 50 MG capsule  Commonly known as:  VIBRAMYCIN     oxyCODONE-acetaminophen 7.5-325 MG per tablet  Commonly known as:  PERCOCET      TAKE these medications        azithromycin 250 MG tablet  Commonly known as:  ZITHROMAX Z-PAK  Take two tablets today, then one tablet daily for four more days starting tomorrow           Follow-up Information    Follow up with Dalia HeadingJENKINS,Tarsha Blando A, MD.   Specialty:  General Surgery   Why:  As needed   Contact  information:   [address] [place] [id number] [phone]       Signed: Franky MachoJENKINS,Amariana Mirando A 08/01/2014, 1:58 PM

## 2014-08-01 NOTE — Discharge Instructions (Signed)
Appendicitis °Appendicitis is when the appendix is swollen (inflamed). The inflammation can lead to developing a hole (perforation) and a collection of pus (abscess). °CAUSES  °There is not always an obvious cause of appendicitis. Sometimes it is caused by an obstruction in the appendix. The obstruction can be caused by: °· A small, hard, pea-sized ball of stool (fecalith). °· Enlarged lymph glands in the appendix. °SYMPTOMS  °· Pain around your belly button (navel) that moves toward your lower right belly (abdomen). The pain can become more severe and sharp as time passes. °· Tenderness in the lower right abdomen. Pain gets worse if you cough or make a sudden movement. °· Feeling sick to your stomach (nauseous). °· Throwing up (vomiting). °· Loss of appetite. °· Fever. °· Constipation. °· Diarrhea. °· Generally not feeling well. °DIAGNOSIS  °· Physical exam. °· Blood tests. °· Urine test. °· X-rays or a CT scan may confirm the diagnosis. °TREATMENT  °Once the diagnosis of appendicitis is made, the most common treatment is to remove the appendix as soon as possible. This procedure is called appendectomy. In an open appendectomy, a cut (incision) is made in the lower right abdomen and the appendix is removed. In a laparoscopic appendectomy, usually 3 small incisions are made. Long, thin instruments and a camera tube are used to remove the appendix. Most patients go home in 24 to 48 hours after appendectomy. °In some situations, the appendix may have already perforated and an abscess may have formed. The abscess may have a "wall" around it as seen on a CT scan. In this case, a drain may be placed into the abscess to remove fluid, and you may be treated with antibiotic medicines that kill germs. The medicine is given through a tube in your vein (IV). Once the abscess has resolved, it may or may not be necessary to have an appendectomy. You may need to stay in the hospital longer than 48 hours. °Document Released:  05/30/2005 Document Revised: 11/29/2011 Document Reviewed: 08/25/2009 °ExitCare® Patient Information ©2015 [employer]. This information is not intended to replace advice given to you by your health care provider. Make sure you discuss any questions you have with your health care provider. ° °

## 2014-08-01 NOTE — Progress Notes (Signed)
Patient discharged with instructions, prescription, and care notes.  Verbalized understanding via teach back.  IV was removed and the site was WNL. Patient voiced no further complaints or concerns at the time of discharge.  Appointments scheduled per instructions.  Patient left the floor via ambulation with staff and family in stable condition. 

## 2014-12-12 ENCOUNTER — Emergency Department (HOSPITAL_COMMUNITY)
Admission: EM | Admit: 2014-12-12 | Discharge: 2014-12-13 | Disposition: A | Discharge status: Home / Self Care | Payer: Managed Care, Other (non HMO) | Attending: Emergency Medicine | Admitting: Emergency Medicine

## 2014-12-12 ENCOUNTER — Encounter (HOSPITAL_COMMUNITY): Payer: Self-pay | Admitting: Emergency Medicine

## 2014-12-12 DIAGNOSIS — Y92410 Unspecified street and highway as the place of occurrence of the external cause: Secondary | ICD-10-CM | POA: Diagnosis not present

## 2014-12-12 DIAGNOSIS — S8011XA Contusion of right lower leg, initial encounter: Secondary | ICD-10-CM

## 2014-12-12 DIAGNOSIS — S8991XA Unspecified injury of right lower leg, initial encounter: Secondary | ICD-10-CM | POA: Diagnosis present

## 2014-12-12 DIAGNOSIS — Z720 Tobacco use: Secondary | ICD-10-CM | POA: Insufficient documentation

## 2014-12-12 DIAGNOSIS — Y9389 Activity, other specified: Secondary | ICD-10-CM | POA: Insufficient documentation

## 2014-12-12 DIAGNOSIS — R42 Dizziness and giddiness: Secondary | ICD-10-CM | POA: Diagnosis not present

## 2014-12-12 DIAGNOSIS — Y998 Other external cause status: Secondary | ICD-10-CM | POA: Insufficient documentation

## 2014-12-12 MED ORDER — TRAMADOL HCL 50 MG PO TABS
50.0000 mg | ORAL_TABLET | Freq: Once | ORAL | Status: AC
  Administered 2014-12-13: 50 mg via ORAL
  Filled 2014-12-12: qty 1

## 2014-12-12 MED ORDER — IBUPROFEN 800 MG PO TABS
800.0000 mg | ORAL_TABLET | Freq: Once | ORAL | Status: AC
  Administered 2014-12-13: 800 mg via ORAL
  Filled 2014-12-12: qty 1
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
## 2014-12-13 ENCOUNTER — Emergency Department (HOSPITAL_COMMUNITY): Payer: Managed Care, Other (non HMO)

## 2014-12-13 MED ORDER — IBUPROFEN 800 MG PO TABS: 800.0000 mg | ORAL_TABLET | Freq: Three times a day (TID) | ORAL | Status: DC | PRN

## 2014-12-13 MED ORDER — TRAMADOL HCL 50 MG PO TABS: 50.0000 mg | ORAL_TABLET | Freq: Four times a day (QID) | ORAL | Status: DC | PRN
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
## 2015-08-29 ENCOUNTER — Encounter (HOSPITAL_COMMUNITY): Payer: Self-pay | Admitting: Emergency Medicine

## 2015-08-29 ENCOUNTER — Emergency Department (HOSPITAL_COMMUNITY)
Admission: EM | Admit: 2015-08-29 | Discharge: 2015-08-29 | Disposition: A | Discharge status: Home / Self Care | Payer: Managed Care, Other (non HMO) | Attending: Emergency Medicine | Admitting: Emergency Medicine

## 2015-08-29 DIAGNOSIS — H65191 Other acute nonsuppurative otitis media, right ear: Secondary | ICD-10-CM | POA: Diagnosis not present

## 2015-08-29 DIAGNOSIS — F17210 Nicotine dependence, cigarettes, uncomplicated: Secondary | ICD-10-CM | POA: Insufficient documentation

## 2015-08-29 DIAGNOSIS — R05 Cough: Secondary | ICD-10-CM | POA: Diagnosis not present

## 2015-08-29 DIAGNOSIS — H9201 Otalgia, right ear: Secondary | ICD-10-CM | POA: Diagnosis present

## 2015-08-29 HISTORY — DX: Accidental discharge from unspecified firearms or gun, initial encounter: W34.00XA

## 2015-08-29 MED ORDER — GUAIFENESIN-CODEINE 100-10 MG/5ML PO SYRP: 10.0000 mL | ORAL_SOLUTION | Freq: Three times a day (TID) | ORAL | Status: DC | PRN

## 2015-08-29 MED ORDER — AMOXICILLIN 500 MG PO CAPS: 500.0000 mg | ORAL_CAPSULE | Freq: Three times a day (TID) | ORAL | Status: DC

## 2015-08-29 NOTE — Discharge Instructions (Signed)
Otitis Media, Adult °Otitis media is redness, soreness, and puffiness (swelling) in the space just behind your eardrum (middle ear). It may be caused by allergies or infection. It often happens along with a cold. °HOME CARE °· Take your medicine as told. Finish it even if you start to feel better. °· Only take over-the-counter or prescription medicines for pain, discomfort, or fever as told by your doctor. °· Follow up with your doctor as told. °GET HELP IF: °· You have otitis media only in one ear, or bleeding from your nose, or both. °· You notice a lump on your neck. °· You are not getting better in 3-5 days. °· You feel worse instead of better. °GET HELP RIGHT AWAY IF:  °· You have pain that is not helped with medicine. °· You have puffiness, redness, or pain around your ear. °· You get a stiff neck. °· You cannot move part of your face (paralysis). °· You notice that the bone behind your ear hurts when you touch it. °MAKE SURE YOU:  °· Understand these instructions. °· Will watch your condition. °· Will get help right away if you are not doing well or get worse. °  °This information is not intended to replace advice given to you by your health care provider. Make sure you discuss any questions you have with your health care provider. °  °Document Released: 11/16/2007 Document Revised: 06/20/2014 Document Reviewed: 12/25/2012 °Elsevier Interactive Patient Education ©2016 Elsevier Inc. ° ° °
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
## 2015-08-29 NOTE — ED Notes (Signed)
PT c/o nasal congestion, sore throat, body aches and dry cough x5 days. PT states right ear pain that started this am. PT denies taking any medications today.

## 2015-11-01 ENCOUNTER — Encounter (HOSPITAL_COMMUNITY): Payer: Self-pay | Admitting: Emergency Medicine

## 2015-11-01 ENCOUNTER — Emergency Department (HOSPITAL_COMMUNITY): Payer: Managed Care, Other (non HMO)

## 2015-11-01 ENCOUNTER — Emergency Department (HOSPITAL_COMMUNITY)
Admission: EM | Admit: 2015-11-01 | Discharge: 2015-11-01 | Disposition: A | Discharge status: Home / Self Care | Payer: Managed Care, Other (non HMO) | Attending: Emergency Medicine | Admitting: Emergency Medicine

## 2015-11-01 DIAGNOSIS — S53401A Unspecified sprain of right elbow, initial encounter: Secondary | ICD-10-CM | POA: Insufficient documentation

## 2015-11-01 DIAGNOSIS — S6991XA Unspecified injury of right wrist, hand and finger(s), initial encounter: Secondary | ICD-10-CM | POA: Diagnosis present

## 2015-11-01 DIAGNOSIS — W500XXA Accidental hit or strike by another person, initial encounter: Secondary | ICD-10-CM | POA: Diagnosis not present

## 2015-11-01 DIAGNOSIS — Y929 Unspecified place or not applicable: Secondary | ICD-10-CM | POA: Diagnosis not present

## 2015-11-01 DIAGNOSIS — F17210 Nicotine dependence, cigarettes, uncomplicated: Secondary | ICD-10-CM | POA: Insufficient documentation

## 2015-11-01 DIAGNOSIS — S61209A Unspecified open wound of unspecified finger without damage to nail, initial encounter: Secondary | ICD-10-CM

## 2015-11-01 DIAGNOSIS — Z23 Encounter for immunization: Secondary | ICD-10-CM | POA: Diagnosis not present

## 2015-11-01 DIAGNOSIS — Y939 Activity, unspecified: Secondary | ICD-10-CM | POA: Insufficient documentation

## 2015-11-01 DIAGNOSIS — S62630A Displaced fracture of distal phalanx of right index finger, initial encounter for closed fracture: Secondary | ICD-10-CM | POA: Diagnosis not present

## 2015-11-01 DIAGNOSIS — Y999 Unspecified external cause status: Secondary | ICD-10-CM | POA: Insufficient documentation

## 2015-11-01 MED ORDER — AMOXICILLIN-POT CLAVULANATE 875-125 MG PO TABS: 1.0000 | ORAL_TABLET | Freq: Two times a day (BID) | ORAL | Status: DC

## 2015-11-01 MED ORDER — NAPROXEN 500 MG PO TABS: 500.0000 mg | ORAL_TABLET | Freq: Two times a day (BID) | ORAL | Status: DC

## 2015-11-01 MED ORDER — TETANUS-DIPHTH-ACELL PERTUSSIS 5-2.5-18.5 LF-MCG/0.5 IM SUSP
0.5000 mL | Freq: Once | INTRAMUSCULAR | Status: AC
  Administered 2015-11-01: 0.5 mL via INTRAMUSCULAR
  Filled 2015-11-01: qty 0.5
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
## 2016-05-11 ENCOUNTER — Emergency Department (HOSPITAL_COMMUNITY)
Admission: EM | Admit: 2016-05-11 | Discharge: 2016-05-11 | Disposition: A | Discharge status: Home / Self Care | Payer: Managed Care, Other (non HMO) | Attending: Dermatology | Admitting: Dermatology

## 2016-05-11 ENCOUNTER — Encounter (HOSPITAL_COMMUNITY): Payer: Self-pay | Admitting: Emergency Medicine

## 2016-05-11 DIAGNOSIS — R109 Unspecified abdominal pain: Secondary | ICD-10-CM | POA: Diagnosis present

## 2016-05-11 DIAGNOSIS — Z5321 Procedure and treatment not carried out due to patient leaving prior to being seen by health care provider: Secondary | ICD-10-CM | POA: Diagnosis not present

## 2016-05-11 DIAGNOSIS — Z79899 Other long term (current) drug therapy: Secondary | ICD-10-CM | POA: Diagnosis not present

## 2016-05-11 DIAGNOSIS — F17210 Nicotine dependence, cigarettes, uncomplicated: Secondary | ICD-10-CM | POA: Diagnosis not present

## 2016-05-11 LAB — CBC
HEMATOCRIT: 43.1 % (ref 39.0–52.0)
HEMOGLOBIN: 14.4 g/dL (ref 13.0–17.0)
MCH: 32.4 pg (ref 26.0–34.0)
MCHC: 33.4 g/dL (ref 30.0–36.0)
MCV: 96.9 fL (ref 78.0–100.0)
Platelets: 285 10*3/uL (ref 150–400)
RBC: 4.45 MIL/uL (ref 4.22–5.81)
RDW: 13.6 % (ref 11.5–15.5)
WBC: 5.4 10*3/uL (ref 4.0–10.5)

## 2016-05-11 LAB — COMPREHENSIVE METABOLIC PANEL
ALBUMIN: 3.8 g/dL (ref 3.5–5.0)
ALT: 20 U/L (ref 17–63)
ANION GAP: 7 (ref 5–15)
AST: 19 U/L (ref 15–41)
Alkaline Phosphatase: 53 U/L (ref 38–126)
BUN: 16 mg/dL (ref 6–20)
CO2: 27 mmol/L (ref 22–32)
Calcium: 9.2 mg/dL (ref 8.9–10.3)
Chloride: 105 mmol/L (ref 101–111)
Creatinine, Ser: 1.11 mg/dL (ref 0.61–1.24)
GFR calc Af Amer: >60 mL/min (ref >60)
GFR calc non Af Amer: >60 mL/min (ref >60)
GLUCOSE: 88 mg/dL (ref 65–99)
POTASSIUM: 4.1 mmol/L (ref 3.5–5.1)
SODIUM: 139 mmol/L (ref 135–145)
TOTAL PROTEIN: 7.5 g/dL (ref 6.5–8.1)
Total Bilirubin: 0.4 mg/dL (ref 0.3–1.2)

## 2016-05-11 LAB — LIPASE, BLOOD: Lipase: 36 U/L (ref 11–51)

## 2016-05-11 NOTE — ED Triage Notes (Signed)
Pt's name called to be brought to ED room. Pt said he was leaving due to a work issue and didn't want to be seen in the ED today.

## 2016-05-11 NOTE — ED Triage Notes (Signed)
Pt reports abd pain, chills,emesis since last night. Pt denies any known fevers. nad noted.

## 2017-08-21 ENCOUNTER — Emergency Department (HOSPITAL_COMMUNITY)
Admission: EM | Admit: 2017-08-21 | Discharge: 2017-08-21 | Disposition: A | Discharge status: Home / Self Care | Payer: Managed Care, Other (non HMO) | Attending: Emergency Medicine | Admitting: Emergency Medicine

## 2017-08-21 ENCOUNTER — Encounter (HOSPITAL_COMMUNITY): Payer: Self-pay | Admitting: Emergency Medicine

## 2017-08-21 ENCOUNTER — Other Ambulatory Visit: Payer: Self-pay

## 2017-08-21 DIAGNOSIS — B9789 Other viral agents as the cause of diseases classified elsewhere: Secondary | ICD-10-CM

## 2017-08-21 DIAGNOSIS — Z79899 Other long term (current) drug therapy: Secondary | ICD-10-CM | POA: Insufficient documentation

## 2017-08-21 DIAGNOSIS — J069 Acute upper respiratory infection, unspecified: Secondary | ICD-10-CM | POA: Insufficient documentation

## 2017-08-21 DIAGNOSIS — F17210 Nicotine dependence, cigarettes, uncomplicated: Secondary | ICD-10-CM | POA: Insufficient documentation

## 2017-08-21 NOTE — Discharge Instructions (Signed)
Your vital signs within normal limits.  Your oxygen level is 97% on room air.  Within normal limits by my interpretation.  Your examination is consistent with a viral illness.  Please wash hands frequently.  Usual mask until symptoms have resolved.  Use Tylenol every 4 hours, or ibuprofen every 6 hours for fever or body aching.  Please use Claritin-D for congestion and cough.  Please increase fluids.  See your primary physician, or return to the emergency department if not improving.

## 2017-08-21 NOTE — ED Triage Notes (Addendum)
PT c/o sore throat, post nasal drainage, cold chills, and non-productive cough x3 days.
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
## 2017-09-20 ENCOUNTER — Other Ambulatory Visit: Payer: Self-pay | Admitting: Advanced Practice Midwife

## 2017-09-20 MED ORDER — AZITHROMYCIN 500 MG PO TABS: 1000.0000 mg | ORAL_TABLET | Freq: Once | ORAL | 0 refills | Status: AC

## 2017-09-20 NOTE — Progress Notes (Signed)
[name] [birth year]

## 2018-03-10 ENCOUNTER — Emergency Department (HOSPITAL_COMMUNITY): Payer: No Typology Code available for payment source

## 2018-03-10 ENCOUNTER — Emergency Department (HOSPITAL_COMMUNITY)
Admission: EM | Admit: 2018-03-10 | Discharge: 2018-03-10 | Disposition: A | Discharge status: Home / Self Care | Payer: No Typology Code available for payment source | Attending: Emergency Medicine | Admitting: Emergency Medicine

## 2018-03-10 ENCOUNTER — Encounter (HOSPITAL_COMMUNITY): Payer: Self-pay | Admitting: Emergency Medicine

## 2018-03-10 ENCOUNTER — Other Ambulatory Visit: Payer: Self-pay

## 2018-03-10 DIAGNOSIS — S0990XA Unspecified injury of head, initial encounter: Secondary | ICD-10-CM | POA: Diagnosis present

## 2018-03-10 DIAGNOSIS — Z79899 Other long term (current) drug therapy: Secondary | ICD-10-CM | POA: Insufficient documentation

## 2018-03-10 DIAGNOSIS — Y939 Activity, unspecified: Secondary | ICD-10-CM | POA: Insufficient documentation

## 2018-03-10 DIAGNOSIS — M79605 Pain in left leg: Secondary | ICD-10-CM | POA: Diagnosis not present

## 2018-03-10 DIAGNOSIS — Y999 Unspecified external cause status: Secondary | ICD-10-CM | POA: Diagnosis not present

## 2018-03-10 DIAGNOSIS — M79641 Pain in right hand: Secondary | ICD-10-CM | POA: Diagnosis not present

## 2018-03-10 DIAGNOSIS — F17210 Nicotine dependence, cigarettes, uncomplicated: Secondary | ICD-10-CM | POA: Diagnosis not present

## 2018-03-10 DIAGNOSIS — S0083XA Contusion of other part of head, initial encounter: Secondary | ICD-10-CM | POA: Insufficient documentation

## 2018-03-10 DIAGNOSIS — Y929 Unspecified place or not applicable: Secondary | ICD-10-CM | POA: Insufficient documentation

## 2018-03-10 MED ORDER — METHOCARBAMOL 500 MG PO TABS: 500.0000 mg | ORAL_TABLET | Freq: Two times a day (BID) | ORAL | 0 refills | Status: DC
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
## 2018-03-10 NOTE — ED Notes (Signed)
ED Provider at bedside. 
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
## 2018-10-20 ENCOUNTER — Emergency Department (HOSPITAL_COMMUNITY)
Admission: EM | Admit: 2018-10-20 | Discharge: 2018-10-20 | Disposition: A | Discharge status: Home / Self Care | Payer: HRSA Program | Attending: Emergency Medicine | Admitting: Emergency Medicine

## 2018-10-20 ENCOUNTER — Other Ambulatory Visit: Payer: Self-pay

## 2018-10-20 ENCOUNTER — Encounter (HOSPITAL_COMMUNITY): Payer: Self-pay | Admitting: Emergency Medicine

## 2018-10-20 DIAGNOSIS — Z79899 Other long term (current) drug therapy: Secondary | ICD-10-CM | POA: Insufficient documentation

## 2018-10-20 DIAGNOSIS — B349 Viral infection, unspecified: Secondary | ICD-10-CM

## 2018-10-20 DIAGNOSIS — Z1159 Encounter for screening for other viral diseases: Secondary | ICD-10-CM | POA: Diagnosis not present

## 2018-10-20 DIAGNOSIS — F17210 Nicotine dependence, cigarettes, uncomplicated: Secondary | ICD-10-CM | POA: Diagnosis not present

## 2018-10-20 DIAGNOSIS — R109 Unspecified abdominal pain: Secondary | ICD-10-CM | POA: Diagnosis present

## 2018-10-20 LAB — GROUP A STREP BY PCR: Group A Strep by PCR: NOT DETECTED

## 2018-10-20 MED ORDER — ONDANSETRON HCL 4 MG PO TABS: 4.0000 mg | ORAL_TABLET | Freq: Four times a day (QID) | ORAL | 0 refills | Status: DC

## 2018-10-20 MED ORDER — ACETAMINOPHEN 500 MG PO TABS
1000.0000 mg | ORAL_TABLET | Freq: Once | ORAL | Status: AC
  Administered 2018-10-20: 1000 mg via ORAL
  Filled 2018-10-20: qty 2

## 2018-10-20 MED ORDER — ONDANSETRON 4 MG PO TBDP
4.0000 mg | ORAL_TABLET | Freq: Once | ORAL | Status: AC
  Administered 2018-10-20: 4 mg via ORAL
  Filled 2018-10-20: qty 1
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
## 2018-10-20 NOTE — ED Triage Notes (Signed)
Patient complaining of abdominal pain, vomiting, and sore throat starting today.

## 2018-10-20 NOTE — Discharge Instructions (Signed)
Your vital signs are within normal limits.  Your oxygen level is 100% on room air at this time.  Your strep test is negative.  Your examination suggest a viral illness.  In accordance with the guidelines from the Centers for Disease Control, a COVID-19 test has been obtained.  Please self quarantine until you have received the results.  Your results will be available and about 48 to 72 hours.  Please use your mask.  Please wash hands frequently.  Increase fluids.  Use Zofran every 6 hours as needed for nausea, or vomiting.  Please keep your distance of 6 feet from others.  Wipe off surfaces as needed.  See your primary physician or return to the emergency department if you have high fevers, worsening of cough, difficulty with breathing, changes in your condition, problems, or concerns.

## 2018-10-22 LAB — NOVEL CORONAVIRUS, NAA (HOSP ORDER, SEND-OUT TO REF LAB; TAT 18-24 HRS): SARS-CoV-2, NAA: NOT DETECTED

## 2019-03-17 ENCOUNTER — Other Ambulatory Visit: Payer: Self-pay

## 2019-03-17 ENCOUNTER — Encounter (HOSPITAL_COMMUNITY): Payer: Self-pay | Admitting: Emergency Medicine

## 2019-03-17 ENCOUNTER — Emergency Department (HOSPITAL_COMMUNITY): Payer: Self-pay

## 2019-03-17 ENCOUNTER — Emergency Department (HOSPITAL_COMMUNITY)
Admission: EM | Admit: 2019-03-17 | Discharge: 2019-03-17 | Disposition: A | Discharge status: Home / Self Care | Payer: Self-pay | Attending: Emergency Medicine | Admitting: Emergency Medicine

## 2019-03-17 DIAGNOSIS — Z20828 Contact with and (suspected) exposure to other viral communicable diseases: Secondary | ICD-10-CM | POA: Insufficient documentation

## 2019-03-17 DIAGNOSIS — R569 Unspecified convulsions: Secondary | ICD-10-CM | POA: Insufficient documentation

## 2019-03-17 DIAGNOSIS — F1910 Other psychoactive substance abuse, uncomplicated: Secondary | ICD-10-CM | POA: Insufficient documentation

## 2019-03-17 DIAGNOSIS — F17210 Nicotine dependence, cigarettes, uncomplicated: Secondary | ICD-10-CM | POA: Insufficient documentation

## 2019-03-17 LAB — CBC
HCT: 41.6 % (ref 39.0–52.0)
Hemoglobin: 13.8 g/dL (ref 13.0–17.0)
MCH: 32 pg (ref 26.0–34.0)
MCHC: 33.2 g/dL (ref 30.0–36.0)
MCV: 96.5 fL (ref 80.0–100.0)
Platelets: 303 10*3/uL (ref 150–400)
RBC: 4.31 MIL/uL (ref 4.22–5.81)
RDW: 13.3 % (ref 11.5–15.5)
WBC: 8 10*3/uL (ref 4.0–10.5)
nRBC: 0 % (ref 0.0–0.2)

## 2019-03-17 LAB — BASIC METABOLIC PANEL
Anion gap: 9 (ref 5–15)
BUN: 10 mg/dL (ref 6–20)
CO2: 24 mmol/L (ref 22–32)
Calcium: 9.4 mg/dL (ref 8.9–10.3)
Chloride: 106 mmol/L (ref 98–111)
Creatinine, Ser: 1.03 mg/dL (ref 0.61–1.24)
GFR calc Af Amer: >60 mL/min (ref >60)
GFR calc non Af Amer: >60 mL/min (ref >60)
Glucose, Bld: 76 mg/dL (ref 70–99)
Potassium: 3.7 mmol/L (ref 3.5–5.1)
Sodium: 139 mmol/L (ref 135–145)

## 2019-03-17 LAB — URINALYSIS, ROUTINE W REFLEX MICROSCOPIC
Bilirubin Urine: NEGATIVE
Glucose, UA: NEGATIVE mg/dL
Hgb urine dipstick: NEGATIVE
Ketones, ur: 5 mg/dL — AB
Leukocytes,Ua: NEGATIVE
Nitrite: NEGATIVE
Protein, ur: NEGATIVE mg/dL
Specific Gravity, Urine: 1.032 — ABNORMAL HIGH (ref 1.005–1.030)
pH: 5 (ref 5.0–8.0)

## 2019-03-17 LAB — ETHANOL: Alcohol, Ethyl (B): <10 mg/dL (ref <10)

## 2019-03-17 LAB — SARS CORONAVIRUS 2 BY RT PCR (HOSPITAL ORDER, PERFORMED IN ~~LOC~~ HOSPITAL LAB): SARS Coronavirus 2: NEGATIVE

## 2019-03-17 LAB — RAPID URINE DRUG SCREEN, HOSP PERFORMED
Amphetamines: POSITIVE — AB
Barbiturates: NOT DETECTED
Benzodiazepines: NOT DETECTED
Cocaine: NOT DETECTED
Opiates: NOT DETECTED
Tetrahydrocannabinol: POSITIVE — AB

## 2019-03-17 LAB — CBG MONITORING, ED: Glucose-Capillary: 70 mg/dL (ref 70–99)

## 2019-03-17 MED ORDER — ACETAMINOPHEN 500 MG PO TABS
1000.0000 mg | ORAL_TABLET | Freq: Once | ORAL | Status: AC
  Administered 2019-03-17: 15:00:00 1000 mg via ORAL
  Filled 2019-03-17: qty 2

## 2019-03-17 MED ORDER — DIPHENHYDRAMINE HCL 50 MG/ML IJ SOLN
25.0000 mg | Freq: Once | INTRAMUSCULAR | Status: AC
  Administered 2019-03-17: 16:00:00 25 mg via INTRAVENOUS
  Filled 2019-03-17: qty 1

## 2019-03-17 MED ORDER — METOCLOPRAMIDE HCL 5 MG/ML IJ SOLN
10.0000 mg | Freq: Once | INTRAMUSCULAR | Status: AC
  Administered 2019-03-17: 16:00:00 10 mg via INTRAVENOUS
  Filled 2019-03-17: qty 2

## 2019-03-17 MED ORDER — SODIUM CHLORIDE 0.9% FLUSH: 3.0000 mL | Freq: Once | INTRAVENOUS | Status: DC
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
## 2019-03-17 NOTE — Discharge Instructions (Addendum)
Please do not operate vehicles or other heavy machinery until you have been cleared by a neurologist.  Department of Motor Vehicle [facility]) of [place] regulations for seizures - It is the patients responsibility to report the incidence of the seizure in the state of [place]. [place] has no statutory provision requiring physicians to report patients diagnosed with epilepsy or seizures to a central state agency.  The recommended DMV regulation requirement for a driver in [place] for an individual with a seizure is that they be seizure-free for 6-12 months. However, the DMV may consider the following exceptions to this general rule where: (1) a physician-directed change in medication causes a seizure and the individual immediately resumes the previous therapy which controlled seizures; (2) there is a history of nocturnal seizures or seizures which do not involve loss of consciousness, loss of control of motor function, or loss of appropriate sensation and information process; and (3) an individual has a seizure disorder preceded by an aura (warning) lasting 2-3 minutes. While the [facility] may also give consideration to other unusual circumstances which may affect the general requirement that drivers be seizure-free for 6-12 months, interpretation of these circumstances and assignment of restrictions is at the discretion of the Medical Advisor. The DMV also considers compliance with medical therapy essential for safe driving. [facility] Physician's Guide to [employer] (June, 1995 ed.)] The Department learns of an individual's condition by inquiring on the application form or renewal form, a physician's report to the [facility], an accident report or from correspondence from the individual. The person may be required to submit a Medical Report Form either annually or semi-annually.
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
## 2019-04-15 ENCOUNTER — Ambulatory Visit: Payer: Self-pay | Admitting: Neurology

## 2019-06-25 ENCOUNTER — Emergency Department (HOSPITAL_COMMUNITY)
Admission: EM | Admit: 2019-06-25 | Discharge: 2019-06-25 | Disposition: A | Discharge status: Home / Self Care | Payer: Self-pay | Attending: Emergency Medicine | Admitting: Emergency Medicine

## 2019-06-25 ENCOUNTER — Other Ambulatory Visit: Payer: Self-pay

## 2019-06-25 ENCOUNTER — Encounter (HOSPITAL_COMMUNITY): Payer: Self-pay

## 2019-06-25 DIAGNOSIS — R519 Headache, unspecified: Secondary | ICD-10-CM | POA: Insufficient documentation

## 2019-06-25 DIAGNOSIS — F17210 Nicotine dependence, cigarettes, uncomplicated: Secondary | ICD-10-CM | POA: Insufficient documentation

## 2019-06-25 DIAGNOSIS — R112 Nausea with vomiting, unspecified: Secondary | ICD-10-CM | POA: Insufficient documentation

## 2019-06-25 MED ORDER — ACETAMINOPHEN 500 MG PO TABS: 500.0000 mg | ORAL_TABLET | Freq: Four times a day (QID) | ORAL | 0 refills | Status: AC | PRN

## 2019-06-25 MED ORDER — ONDANSETRON HCL 4 MG PO TABS: 4.0000 mg | ORAL_TABLET | Freq: Three times a day (TID) | ORAL | 0 refills | Status: AC | PRN

## 2019-06-25 MED ORDER — IBUPROFEN 600 MG PO TABS: 600.0000 mg | ORAL_TABLET | Freq: Four times a day (QID) | ORAL | 0 refills | Status: AC | PRN

## 2019-06-25 NOTE — ED Triage Notes (Signed)
Pt c/o headache and nausea that started last night at work.  Reports vomited once last night.  Denies any cough, fever, or sob.  Denies any diarrhea or abd pain.  Pt says feeling a little better now but still nauseated.
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
## 2019-06-25 NOTE — Discharge Instructions (Addendum)
You can take 1 to 2 tablets of Tylenol (350mg -1000mg  depending on the dose) every 6 hours as needed for pain or fever.  Do not exceed 4000 mg of Tylenol daily.  If your pain persists you can take a dose of ibuprofen in between doses of Tylenol.  I usually recommend 400 to 600 mg of ibuprofen every 6 hours.  Take this with food to avoid upset stomach issues.  Take Zofran as needed for nausea and vomiting.  Wait around 10 to 15 minutes before you have anything to eat or drink to give this medicine time to work.  Drink plenty of water and get plenty of rest.  Return to the emergency department immediately if any concerning signs or symptoms develop such as high fevers, neck stiffness, altered mental status, persistent vomiting, loss of consciousness, or severe headache

## 2019-07-02 ENCOUNTER — Other Ambulatory Visit: Payer: Self-pay

## 2019-07-02 DIAGNOSIS — Z20822 Contact with and (suspected) exposure to covid-19: Secondary | ICD-10-CM | POA: Insufficient documentation

## 2019-07-02 DIAGNOSIS — R519 Headache, unspecified: Secondary | ICD-10-CM | POA: Insufficient documentation

## 2019-07-02 DIAGNOSIS — R5383 Other fatigue: Secondary | ICD-10-CM | POA: Insufficient documentation

## 2019-07-02 DIAGNOSIS — R110 Nausea: Secondary | ICD-10-CM | POA: Insufficient documentation

## 2019-07-02 DIAGNOSIS — F17210 Nicotine dependence, cigarettes, uncomplicated: Secondary | ICD-10-CM | POA: Insufficient documentation

## 2019-07-03 ENCOUNTER — Emergency Department (HOSPITAL_COMMUNITY)
Admission: EM | Admit: 2019-07-03 | Discharge: 2019-07-03 | Disposition: A | Discharge status: Home / Self Care | Payer: Self-pay | Attending: Emergency Medicine | Admitting: Emergency Medicine

## 2019-07-03 ENCOUNTER — Encounter (HOSPITAL_COMMUNITY): Payer: Self-pay | Admitting: Emergency Medicine

## 2019-07-03 ENCOUNTER — Other Ambulatory Visit: Payer: Self-pay

## 2019-07-03 ENCOUNTER — Emergency Department (HOSPITAL_COMMUNITY): Payer: Self-pay

## 2019-07-03 DIAGNOSIS — R519 Headache, unspecified: Secondary | ICD-10-CM

## 2019-07-03 LAB — SARS CORONAVIRUS 2 (TAT 6-24 HRS): SARS Coronavirus 2: NEGATIVE

## 2019-07-03 MED ORDER — KETOROLAC TROMETHAMINE 60 MG/2ML IM SOLN
60.0000 mg | Freq: Once | INTRAMUSCULAR | Status: AC
  Administered 2019-07-03: 02:00:00 60 mg via INTRAMUSCULAR
  Filled 2019-07-03: qty 2

## 2019-07-03 NOTE — ED Notes (Signed)
Pt currently sleeping in lobby when this nurse went to get him for triage.

## 2019-07-03 NOTE — Discharge Instructions (Addendum)
Once your test is negative you can return to work. If positive, you need to quarantine for 10 days.
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
## 2019-07-03 NOTE — ED Triage Notes (Signed)
Pt c/o not feeling good since Sunday night at work. Pt says he hasn't been able to go to work since then. Pt very vague about symptoms or complaints.

## 2019-07-22 ENCOUNTER — Emergency Department (HOSPITAL_COMMUNITY): Payer: Self-pay

## 2019-07-22 ENCOUNTER — Other Ambulatory Visit: Payer: Self-pay

## 2019-07-22 ENCOUNTER — Encounter (HOSPITAL_COMMUNITY): Payer: Self-pay | Admitting: Emergency Medicine

## 2019-07-22 ENCOUNTER — Emergency Department (HOSPITAL_COMMUNITY)
Admission: EM | Admit: 2019-07-22 | Discharge: 2019-07-22 | Disposition: A | Discharge status: Home / Self Care | Payer: Self-pay | Attending: Emergency Medicine | Admitting: Emergency Medicine

## 2019-07-22 DIAGNOSIS — M25531 Pain in right wrist: Secondary | ICD-10-CM | POA: Insufficient documentation

## 2019-07-22 DIAGNOSIS — F17210 Nicotine dependence, cigarettes, uncomplicated: Secondary | ICD-10-CM | POA: Insufficient documentation

## 2019-07-22 MED ORDER — ACETAMINOPHEN 500 MG PO TABS
1000.0000 mg | ORAL_TABLET | Freq: Once | ORAL | Status: AC
  Administered 2019-07-22: 1000 mg via ORAL
  Filled 2019-07-22: qty 2

## 2019-07-22 MED ORDER — IBUPROFEN 400 MG PO TABS
600.0000 mg | ORAL_TABLET | Freq: Once | ORAL | Status: AC
  Administered 2019-07-22: 600 mg via ORAL
  Filled 2019-07-22: qty 2

## 2019-07-22 NOTE — ED Triage Notes (Signed)
C/o right wrist pain, started today.  Rates pain 10/10.  Denies injury.

## 2019-07-22 NOTE — Discharge Instructions (Addendum)
Please take Ibuprofen (Advil, motrin) and Tylenol (acetaminophen) to relieve your pain.  You may take up to 600 MG (3 pills) of normal strength ibuprofen every 8 hours as needed.  In between doses of ibuprofen you make take tylenol, up to 1,000 mg (two extra strength pills).  Do not take more than 3,000 mg tylenol in a 24 hour period.  Please check all medication labels as many medications such as pain and cold medications may contain tylenol.  Do not drink alcohol while taking these medications.  Do not take other NSAID'S while taking ibuprofen (such as aleve or naproxen).  Please take ibuprofen with food to decrease stomach upset.  I have given you some information to read about dietary changes you can take to reduce the chance of you getting gout.  If you develop fevers, significant redness, have any worsening symptoms or new concerns please seek additional medical care and evaluation.
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
## 2020-07-24 ENCOUNTER — Encounter (HOSPITAL_COMMUNITY): Payer: Self-pay | Admitting: *Deleted

## 2020-07-24 ENCOUNTER — Other Ambulatory Visit: Payer: Self-pay

## 2020-07-24 ENCOUNTER — Emergency Department (HOSPITAL_COMMUNITY)
Admission: EM | Admit: 2020-07-24 | Discharge: 2020-07-24 | Disposition: A | Discharge status: Home / Self Care | Payer: Self-pay | Attending: Emergency Medicine | Admitting: Emergency Medicine

## 2020-07-24 DIAGNOSIS — Z20822 Contact with and (suspected) exposure to covid-19: Secondary | ICD-10-CM | POA: Insufficient documentation

## 2020-07-24 DIAGNOSIS — F17210 Nicotine dependence, cigarettes, uncomplicated: Secondary | ICD-10-CM | POA: Insufficient documentation

## 2020-07-24 DIAGNOSIS — K529 Noninfective gastroenteritis and colitis, unspecified: Secondary | ICD-10-CM | POA: Insufficient documentation

## 2020-07-24 LAB — COMPREHENSIVE METABOLIC PANEL
ALT: 37 U/L (ref 0–44)
AST: 22 U/L (ref 15–41)
Albumin: 3.4 g/dL — ABNORMAL LOW (ref 3.5–5.0)
Alkaline Phosphatase: 69 U/L (ref 38–126)
Anion gap: 6 (ref 5–15)
BUN: 15 mg/dL (ref 6–20)
CO2: 24 mmol/L (ref 22–32)
Calcium: 8.7 mg/dL — ABNORMAL LOW (ref 8.9–10.3)
Chloride: 107 mmol/L (ref 98–111)
Creatinine, Ser: 0.94 mg/dL (ref 0.61–1.24)
GFR, Estimated: >60 mL/min (ref >60)
Glucose, Bld: 91 mg/dL (ref 70–99)
Potassium: 4.3 mmol/L (ref 3.5–5.1)
Sodium: 137 mmol/L (ref 135–145)
Total Bilirubin: 0.3 mg/dL (ref 0.3–1.2)
Total Protein: 7 g/dL (ref 6.5–8.1)

## 2020-07-24 LAB — CBC WITH DIFFERENTIAL/PLATELET
Abs Immature Granulocytes: 0.01 10*3/uL (ref 0.00–0.07)
Basophils Absolute: 0.1 10*3/uL (ref 0.0–0.1)
Basophils Relative: 1 %
Eosinophils Absolute: 0.5 10*3/uL (ref 0.0–0.5)
Eosinophils Relative: 9 %
HCT: 41.7 % (ref 39.0–52.0)
Hemoglobin: 13.5 g/dL (ref 13.0–17.0)
Immature Granulocytes: 0 %
Lymphocytes Relative: 34 %
Lymphs Abs: 1.8 10*3/uL (ref 0.7–4.0)
MCH: 31.3 pg (ref 26.0–34.0)
MCHC: 32.4 g/dL (ref 30.0–36.0)
MCV: 96.5 fL (ref 80.0–100.0)
Monocytes Absolute: 0.6 10*3/uL (ref 0.1–1.0)
Monocytes Relative: 12 %
Neutro Abs: 2.3 10*3/uL (ref 1.7–7.7)
Neutrophils Relative %: 44 %
Platelets: 277 10*3/uL (ref 150–400)
RBC: 4.32 MIL/uL (ref 4.22–5.81)
RDW: 13.2 % (ref 11.5–15.5)
WBC: 5.2 10*3/uL (ref 4.0–10.5)
nRBC: 0 % (ref 0.0–0.2)

## 2020-07-24 LAB — POC SARS CORONAVIRUS 2 AG -  ED: SARS Coronavirus 2 Ag: NEGATIVE

## 2020-07-24 MED ORDER — ONDANSETRON 4 MG PO TBDP: ORAL_TABLET | ORAL | 0 refills | Status: DC

## 2020-07-24 NOTE — ED Triage Notes (Signed)
C/o abdominal pain, had several loose stools yesterday and is better today, states he still has some body aches. Family members have had the same

## 2020-07-24 NOTE — Discharge Instructions (Addendum)
Drink plenty of fluids and follow up if not improving. °
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
## 2021-03-22 IMAGING — CT CT HEAD W/O CM
4 series · 16 of 47 positions shown, 18 images · non-contrast
Comparison: 03/10/2018 head CT.

CLINICAL DATA: Syncopal episode.  Head injury.  Seizure.

EXAM:
CT HEAD WITHOUT CONTRAST
TECHNIQUE: Contiguous axial images were obtained from the base of the skull
through the vertex without intravenous contrast.

[Series 3: head wo · axial · 0.54mm/px · z∈[-128,+6]mm · 7 of 37 slices shown, 9 images]
[im 5/37  brain]
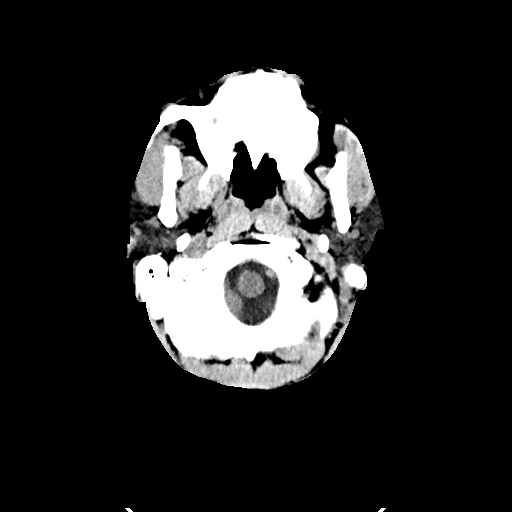
[im 5/37  bone]
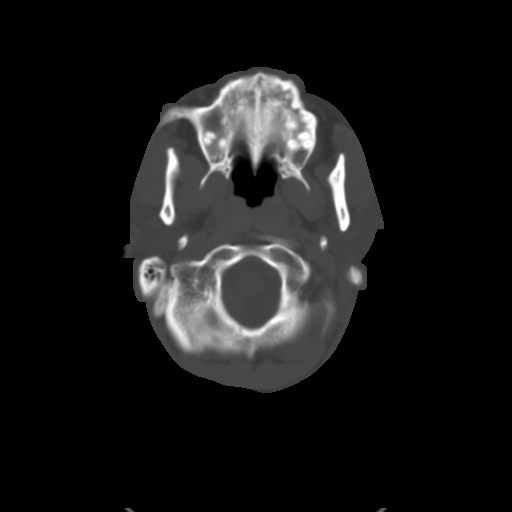
[im 10/37  brain]
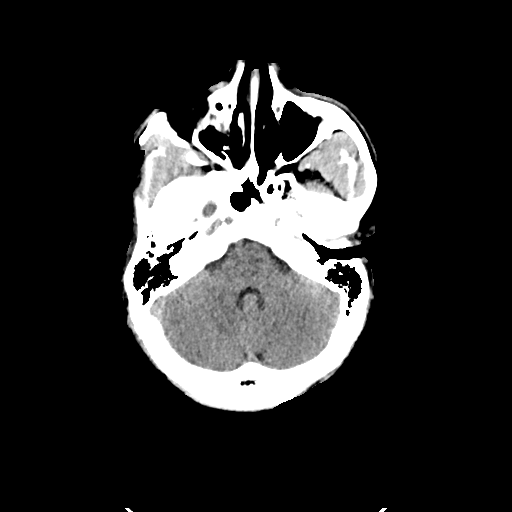
[im 14/37  brain]
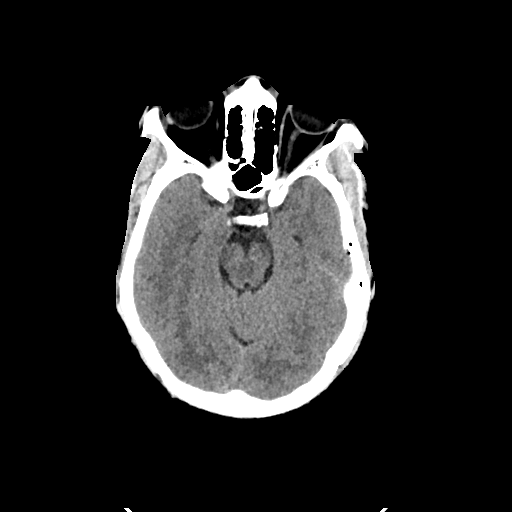
[im 19/37  brain]
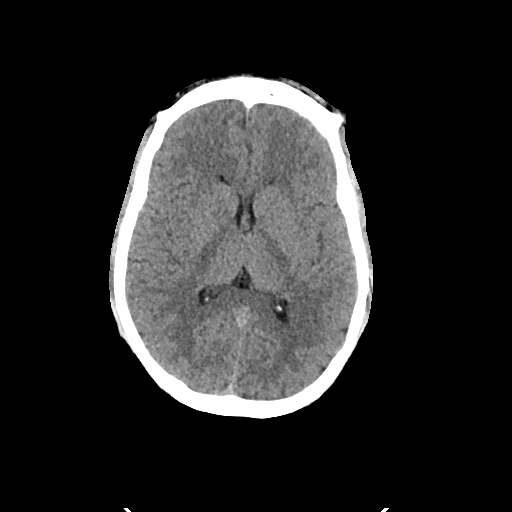
[im 23/37  brain]
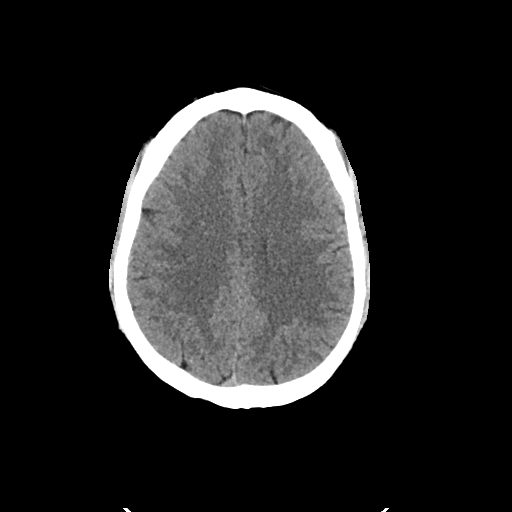
[im 23/37  bone]
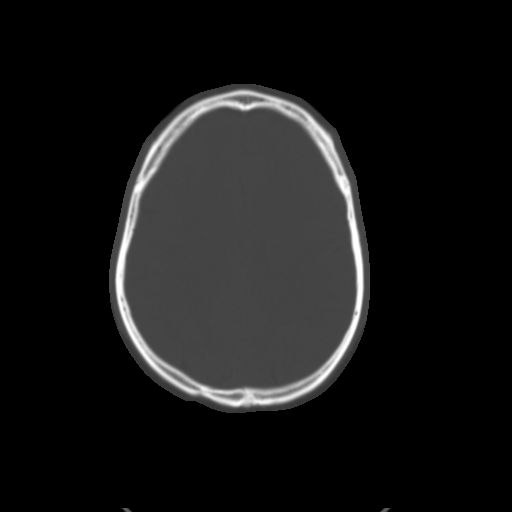
[im 28/37  brain]
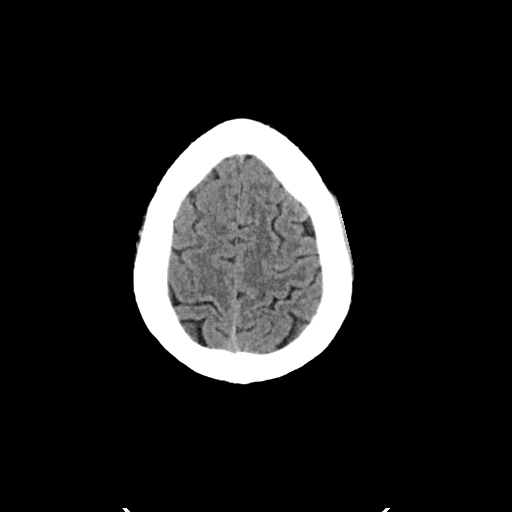
[im 32/37  brain]
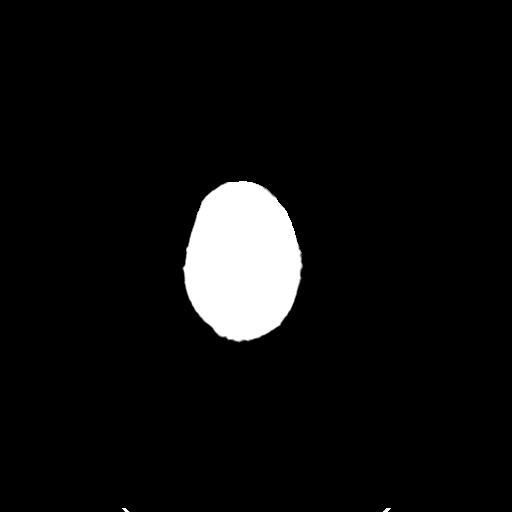

[Series 4: head bone · axial · 0.54mm/px · z∈[-130,-94]mm · 3 of 91 slices shown]
[im 10/91  bone]
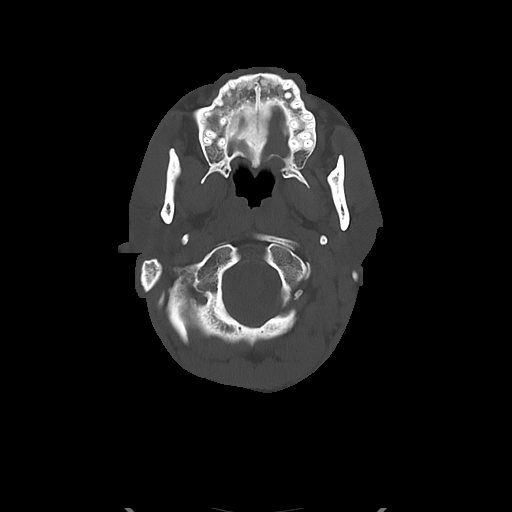
[im 19/91  bone]
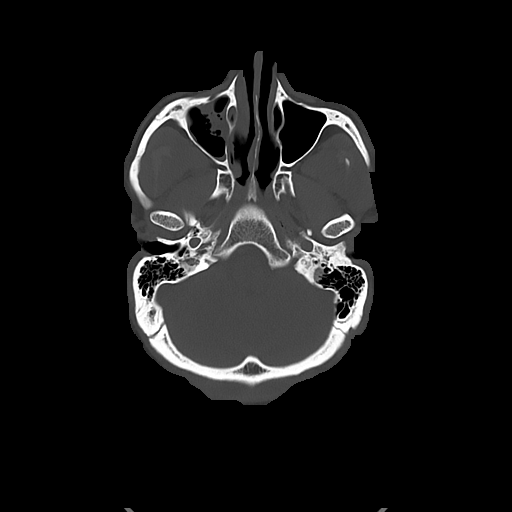
[im 28/91  bone]
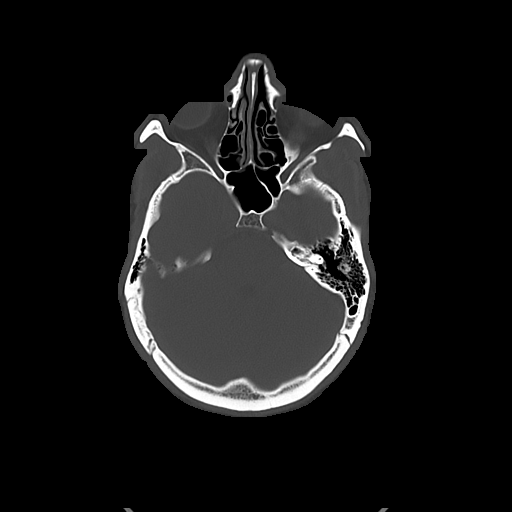

[Series 5: cor soft · coronal · 0.35mm/px · 3 of 77 slices shown]
[im 26/77  brain]
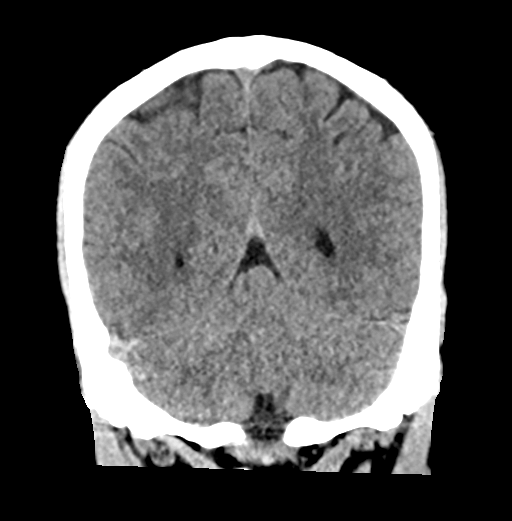
[im 34/77  brain]
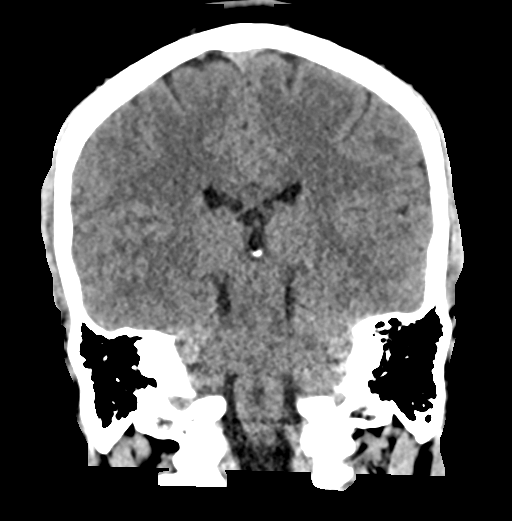
[im 43/77  brain]
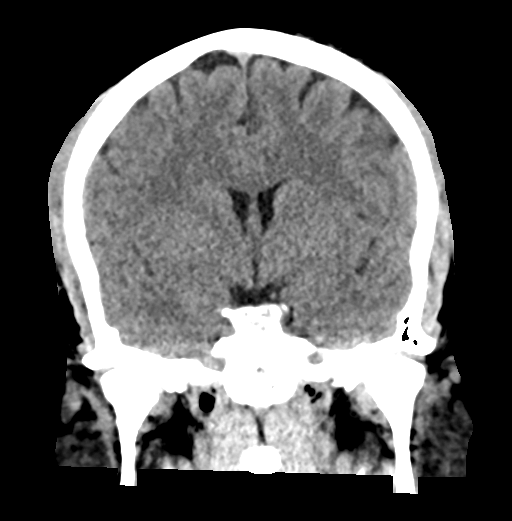

[Series 6: sag soft · sagittal · 0.35mm/px · 3 of 60 slices shown]
[im 20/60  brain]
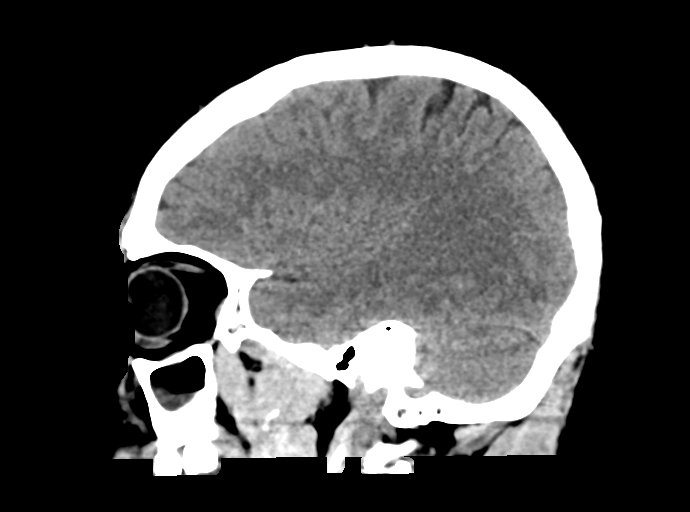
[im 30/60  brain]
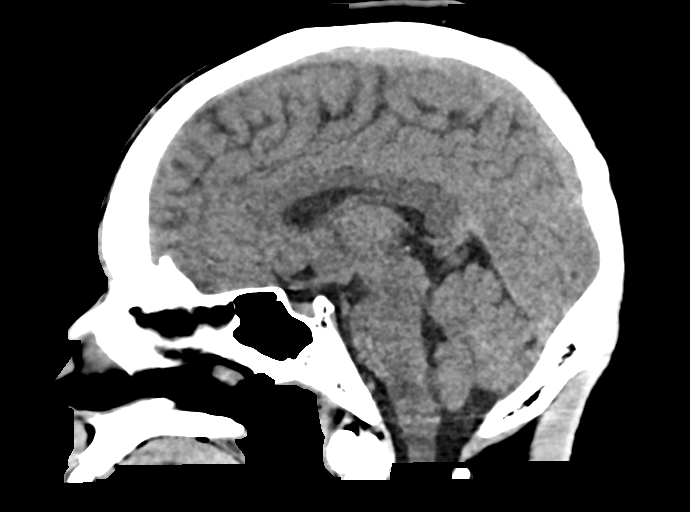
[im 40/60  brain]
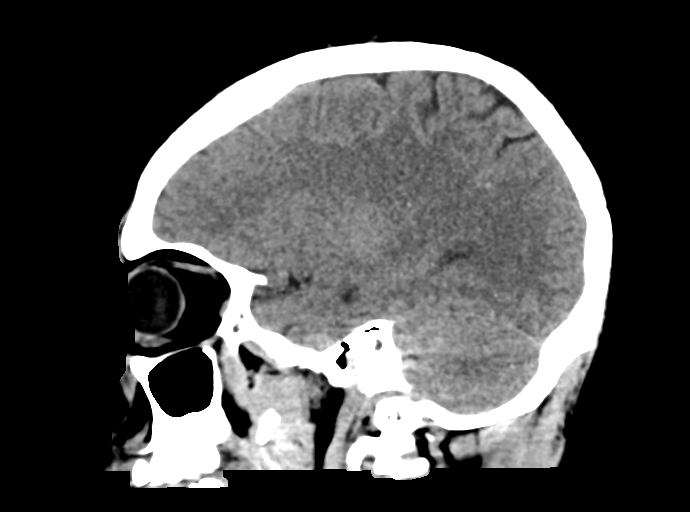

[16 of 47 positions shown; findings below may reference images not displayed]

FINDINGS: Brain: No evidence of parenchymal hemorrhage or extra-axial fluid
collection. No mass lesion, mass effect, or midline shift. No CT
evidence of acute infarction. Cerebral volume is age appropriate. No
ventriculomegaly.

Vascular: No acute abnormality.

Skull: No evidence of calvarial fracture.

Sinuses/Orbits: No fluid levels. Mucoperiosteal thickening in the
right maxillary sinus with patchy mucoid debris in the inferior
right maxillary sinus.

Other:  The mastoid air cells are unopacified.
IMPRESSION: 1. No evidence of acute intracranial abnormality. No evidence of
calvarial fracture.
2. Right maxillary sinusitis, chronic appearing.

## 2021-04-27 ENCOUNTER — Emergency Department (HOSPITAL_COMMUNITY)
Admission: EM | Admit: 2021-04-27 | Discharge: 2021-04-27 | Disposition: A | Discharge status: Home / Self Care | Payer: Self-pay | Attending: Emergency Medicine | Admitting: Emergency Medicine

## 2021-04-27 ENCOUNTER — Emergency Department (HOSPITAL_COMMUNITY): Payer: Self-pay

## 2021-04-27 ENCOUNTER — Other Ambulatory Visit: Payer: Self-pay

## 2021-04-27 ENCOUNTER — Encounter (HOSPITAL_COMMUNITY): Payer: Self-pay | Admitting: *Deleted

## 2021-04-27 DIAGNOSIS — F17210 Nicotine dependence, cigarettes, uncomplicated: Secondary | ICD-10-CM | POA: Insufficient documentation

## 2021-04-27 DIAGNOSIS — B349 Viral infection, unspecified: Secondary | ICD-10-CM | POA: Insufficient documentation

## 2021-04-27 DIAGNOSIS — Z20822 Contact with and (suspected) exposure to covid-19: Secondary | ICD-10-CM | POA: Insufficient documentation

## 2021-04-27 LAB — RESP PANEL BY RT-PCR (FLU A&B, COVID) ARPGX2
Influenza A by PCR: NEGATIVE
Influenza B by PCR: NEGATIVE
SARS Coronavirus 2 by RT PCR: NEGATIVE

## 2021-04-27 MED ORDER — IBUPROFEN 800 MG PO TABS
800.0000 mg | ORAL_TABLET | Freq: Once | ORAL | Status: AC
  Administered 2021-04-27: 800 mg via ORAL
  Filled 2021-04-27: qty 1

## 2021-04-27 MED ORDER — ONDANSETRON 4 MG PO TBDP
4.0000 mg | ORAL_TABLET | Freq: Once | ORAL | Status: AC
  Administered 2021-04-27: 4 mg via ORAL
  Filled 2021-04-27: qty 1

## 2021-04-27 MED ORDER — ONDANSETRON 4 MG PO TBDP: ORAL_TABLET | ORAL | 0 refills | Status: AC

## 2021-04-27 NOTE — Discharge Instructions (Signed)
Drink plenty of fluids.  Take Tylenol or Motrin for fevers and aches.  Follow-up if not improving with your doctor

## 2021-04-27 NOTE — ED Triage Notes (Signed)
Flu symptoms onset today

## 2021-04-27 NOTE — ED Triage Notes (Signed)
Request work note
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
## 2021-04-27 NOTE — ED Triage Notes (Signed)
Multiple complaints

## 2021-05-10 ENCOUNTER — Other Ambulatory Visit: Payer: Self-pay

## 2021-05-10 ENCOUNTER — Ambulatory Visit
Admission: EM | Admit: 2021-05-10 | Discharge: 2021-05-10 | Disposition: A | Discharge status: Home / Self Care | Payer: Self-pay | Attending: Family Medicine | Admitting: Family Medicine

## 2021-05-10 DIAGNOSIS — R509 Fever, unspecified: Secondary | ICD-10-CM

## 2021-05-10 DIAGNOSIS — Z1152 Encounter for screening for COVID-19: Secondary | ICD-10-CM

## 2021-05-10 DIAGNOSIS — Z20828 Contact with and (suspected) exposure to other viral communicable diseases: Secondary | ICD-10-CM

## 2021-05-10 DIAGNOSIS — J069 Acute upper respiratory infection, unspecified: Secondary | ICD-10-CM

## 2021-05-10 MED ORDER — PROMETHAZINE-DM 6.25-15 MG/5ML PO SYRP: 5.0000 mL | ORAL_SOLUTION | Freq: Four times a day (QID) | ORAL | 0 refills | Status: AC | PRN

## 2021-05-10 MED ORDER — OSELTAMIVIR PHOSPHATE 75 MG PO CAPS: 75.0000 mg | ORAL_CAPSULE | Freq: Two times a day (BID) | ORAL | 0 refills | Status: AC

## 2021-05-10 NOTE — ED Triage Notes (Signed)
Pt presents with cough and body aches that began yesterday , daughter tested positive for flu
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
## 2021-05-11 LAB — COVID-19, FLU A+B NAA
Influenza A, NAA: NOT DETECTED
Influenza B, NAA: NOT DETECTED
SARS-CoV-2, NAA: NOT DETECTED

## 2021-07-27 IMAGING — DX DG WRIST COMPLETE 3+V*R*
4 series · 4 of 4 positions shown · non-contrast
Comparison: 03/10/2018

CLINICAL DATA: Posterior right wrist pain that began today, no
history of trauma a

EXAM:
RIGHT WRIST - COMPLETE 3+ VIEW

[wrist pa]
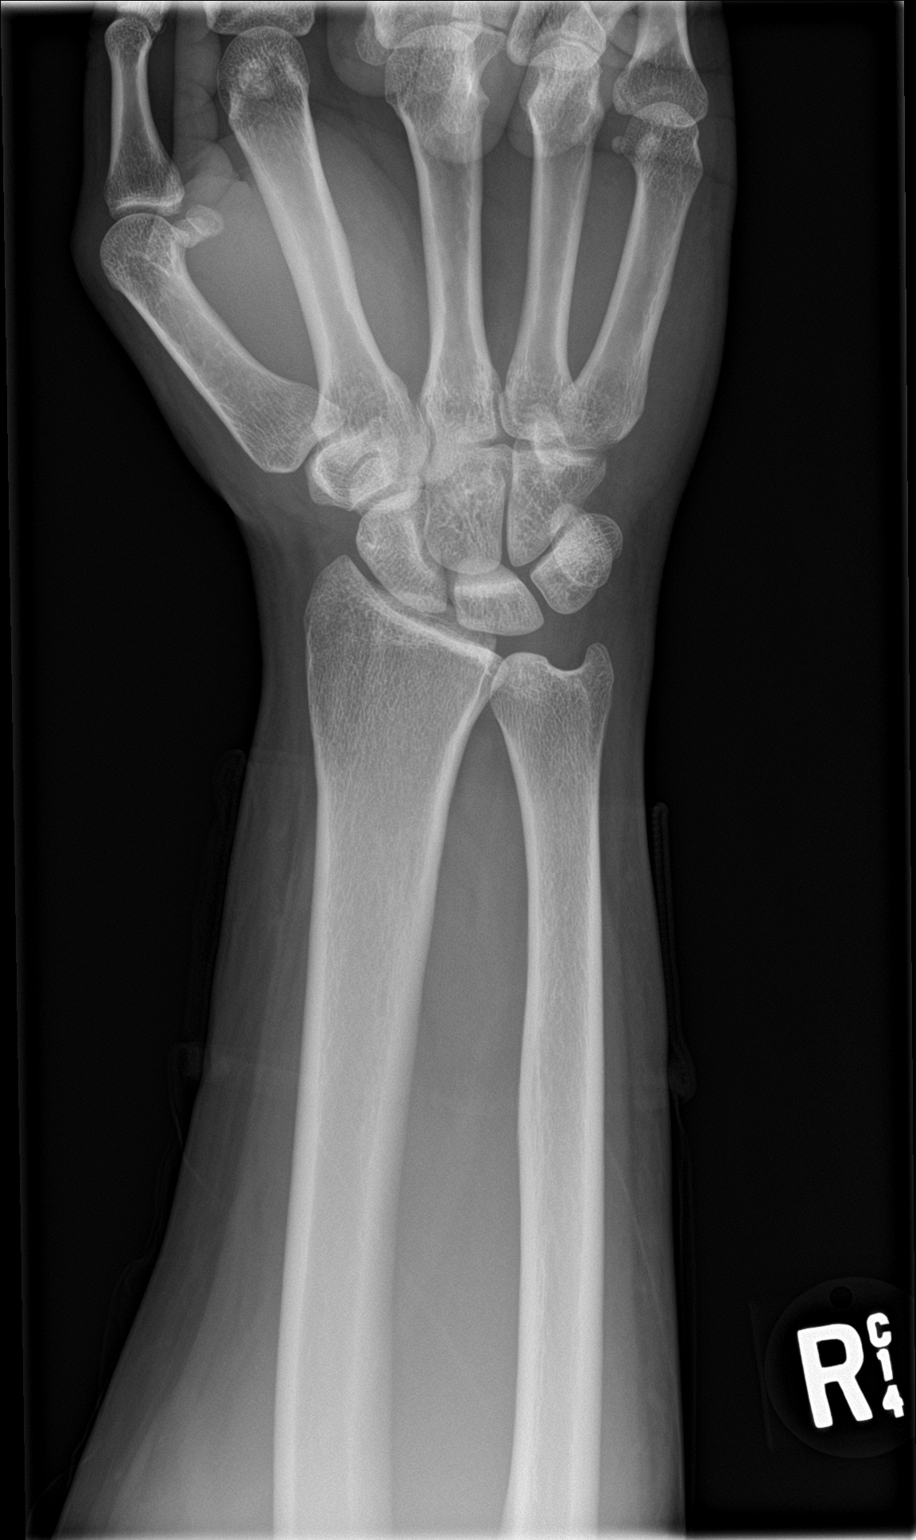

[wrist obl]
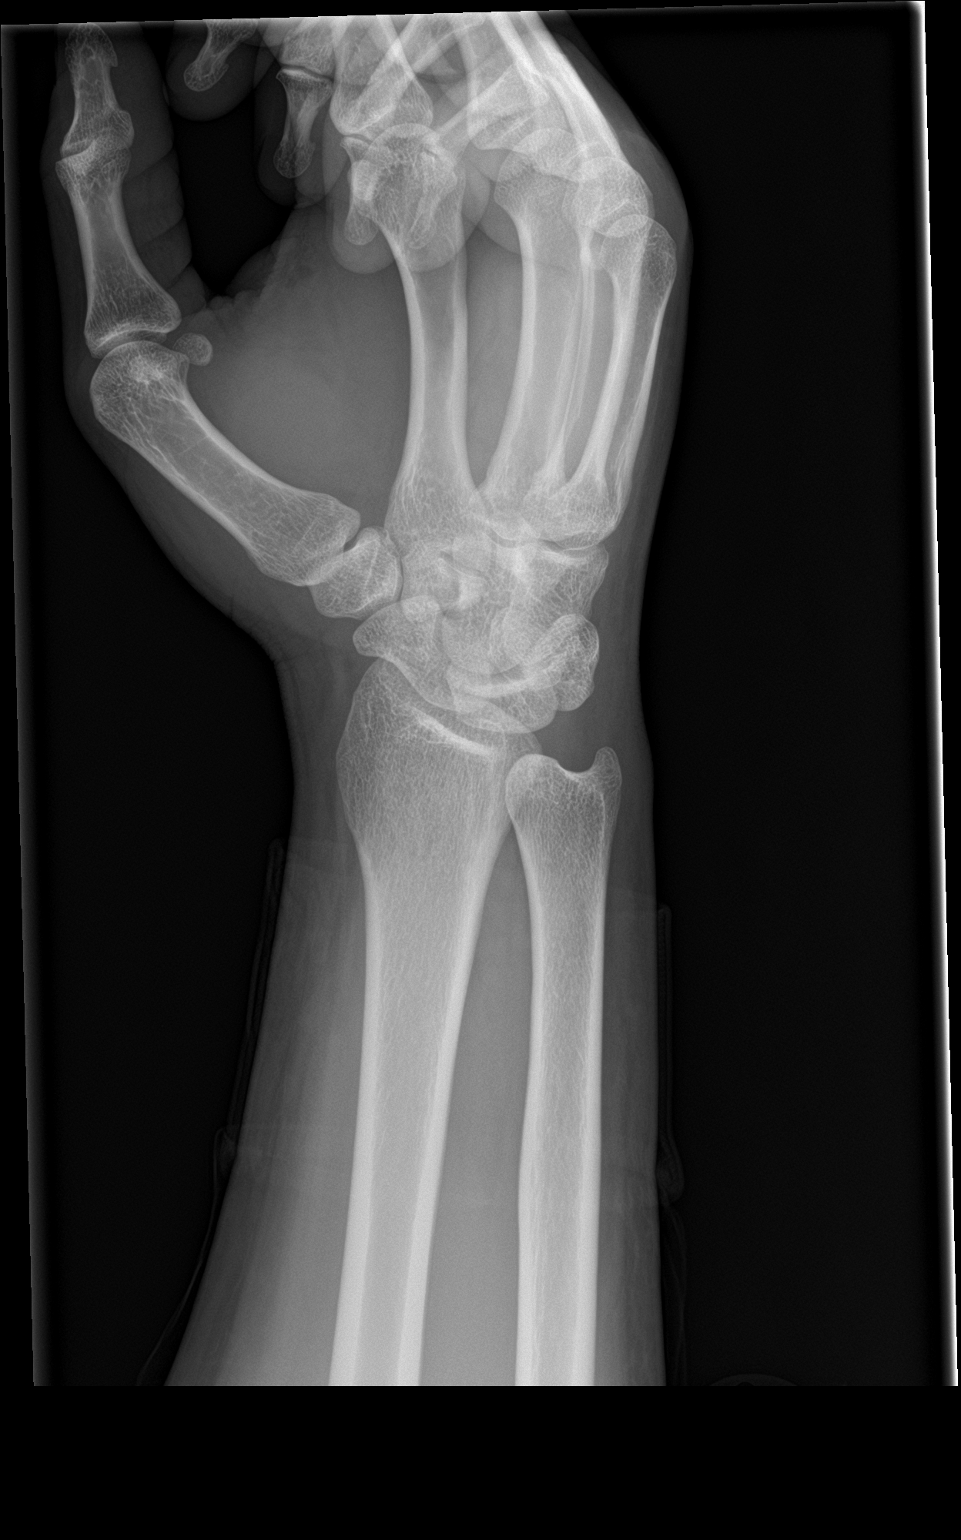

[wrist lat]
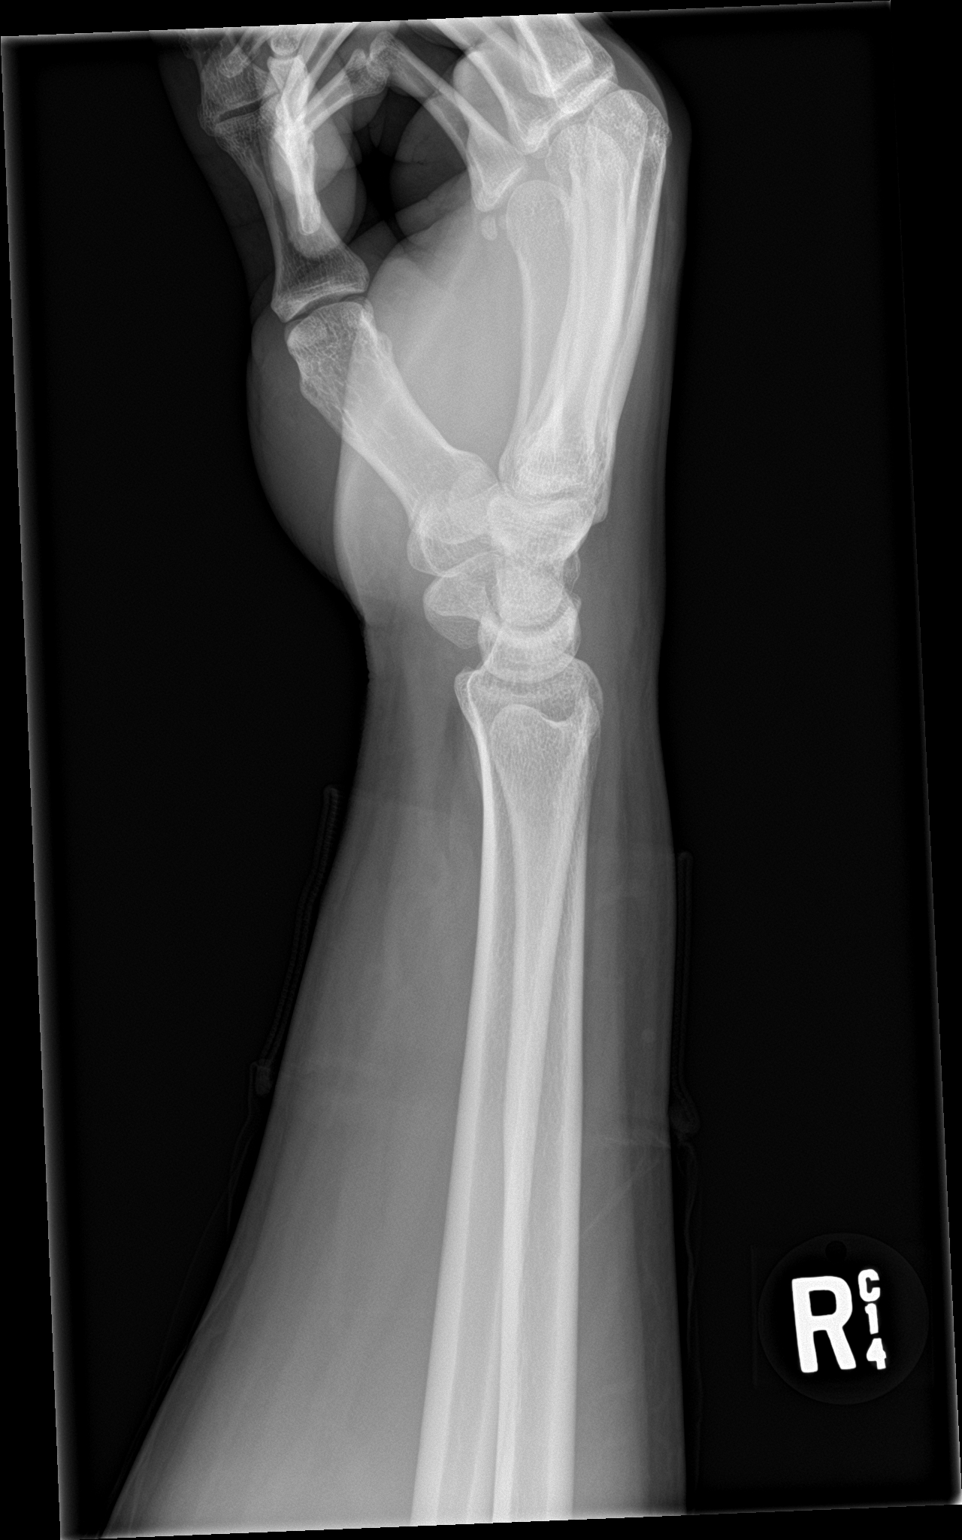

[wrist navicular]
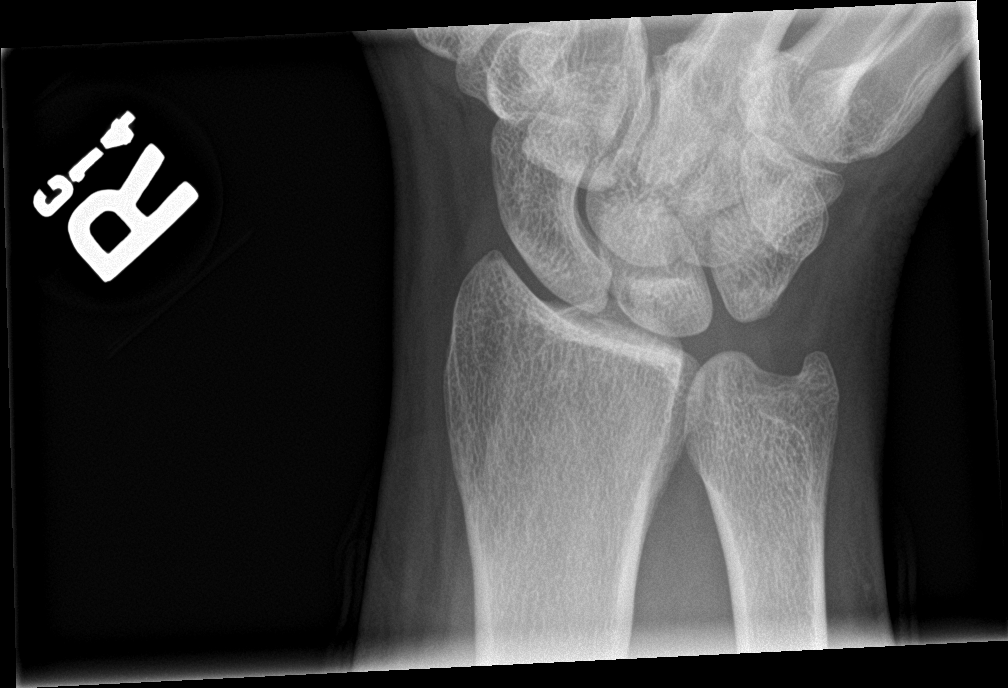

[4 of 4 positions shown; findings below may reference images not displayed]

FINDINGS: Frontal, oblique, lateral, and ulnar deviated views of the right
wrist demonstrate no acute displaced fractures. Alignment is
anatomic. Joint spaces are well preserved. Soft tissues are normal.
IMPRESSION: 1. Unremarkable right wrist.

## 2021-12-20 ENCOUNTER — Other Ambulatory Visit: Payer: Self-pay

## 2021-12-20 ENCOUNTER — Encounter (HOSPITAL_COMMUNITY): Payer: Self-pay | Admitting: Emergency Medicine

## 2021-12-20 DIAGNOSIS — Z20822 Contact with and (suspected) exposure to covid-19: Secondary | ICD-10-CM | POA: Insufficient documentation

## 2021-12-20 DIAGNOSIS — Z5321 Procedure and treatment not carried out due to patient leaving prior to being seen by health care provider: Secondary | ICD-10-CM | POA: Insufficient documentation

## 2021-12-20 DIAGNOSIS — J020 Streptococcal pharyngitis: Secondary | ICD-10-CM | POA: Insufficient documentation

## 2021-12-20 LAB — RESP PANEL BY RT-PCR (FLU A&B, COVID) ARPGX2
Influenza A by PCR: NEGATIVE
Influenza B by PCR: NEGATIVE
SARS Coronavirus 2 by RT PCR: NEGATIVE

## 2021-12-20 LAB — GROUP A STREP BY PCR: Group A Strep by PCR: DETECTED — AB

## 2021-12-20 NOTE — ED Triage Notes (Signed)
Pt states headache, sore throat and chills started yesterday worse today. Doesn't know if he has had a fever took ibuprofen 1 hour prior to triage. No fever at this time.

## 2021-12-21 ENCOUNTER — Emergency Department (HOSPITAL_COMMUNITY)
Admission: EM | Admit: 2021-12-21 | Discharge: 2021-12-21 | Discharge status: Against Medical Advice | Payer: Self-pay | Attending: Student | Admitting: Student

## 2021-12-22 ENCOUNTER — Emergency Department (HOSPITAL_COMMUNITY)
Admission: EM | Admit: 2021-12-22 | Discharge: 2021-12-22 | Discharge status: Against Medical Advice | Payer: Self-pay | Attending: Emergency Medicine | Admitting: Emergency Medicine

## 2021-12-22 ENCOUNTER — Encounter (HOSPITAL_COMMUNITY): Payer: Self-pay

## 2021-12-22 ENCOUNTER — Other Ambulatory Visit: Payer: Self-pay

## 2021-12-22 DIAGNOSIS — Z5321 Procedure and treatment not carried out due to patient leaving prior to being seen by health care provider: Secondary | ICD-10-CM | POA: Insufficient documentation

## 2021-12-22 DIAGNOSIS — R55 Syncope and collapse: Secondary | ICD-10-CM | POA: Insufficient documentation

## 2021-12-22 NOTE — ED Triage Notes (Signed)
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

## 2022-07-18 DIAGNOSIS — F1220 Cannabis dependence, uncomplicated: Secondary | ICD-10-CM | POA: Diagnosis not present

## 2023-05-03 IMAGING — DX DG CHEST 1V PORT
1 series · 1 of 1 positions shown · non-contrast
Comparison: Chest x-ray 07/03/2019.

CLINICAL DATA: Shortness of breath, nausea and cough.

EXAM:
PORTABLE CHEST 1 VIEW

[chest ap]
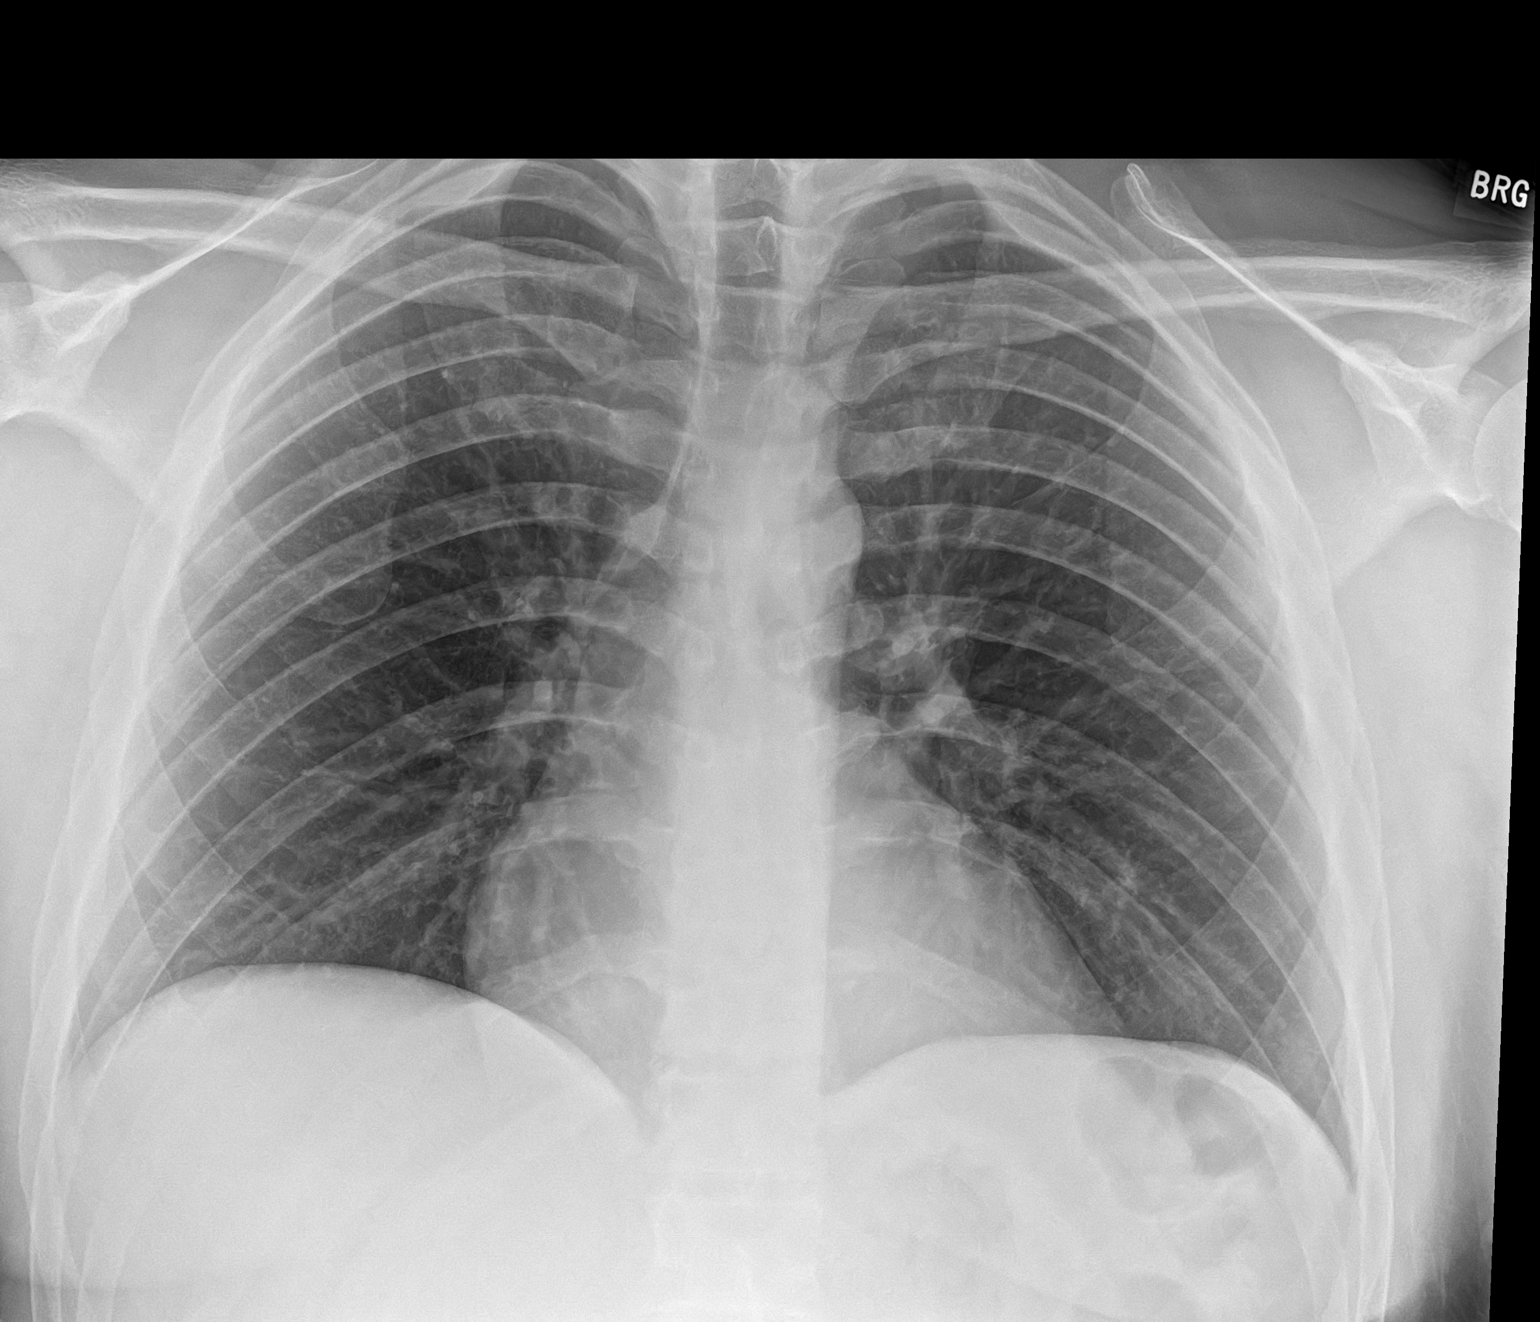

[1 of 1 positions shown; findings below may reference images not displayed]

FINDINGS: The heart size and mediastinal contours are within normal limits.
Both lungs are clear. The visualized skeletal structures are
unremarkable.
IMPRESSION: No active disease.
